# Patient Record
Sex: Male | Born: 1940 | Race: White | Hispanic: No | Marital: Married | Smoking: Former smoker
Health system: Southern US, Community
[De-identification: ages and names within clinical notes are randomized; demographics above are authoritative.]

## PROBLEM LIST (undated history)

## (undated) DIAGNOSIS — R001 Bradycardia, unspecified: Secondary | ICD-10-CM

## (undated) DIAGNOSIS — R4781 Slurred speech: Secondary | ICD-10-CM

## (undated) DIAGNOSIS — Z720 Tobacco use: Secondary | ICD-10-CM

## (undated) DIAGNOSIS — Z9119 Patient's noncompliance with other medical treatment and regimen: Secondary | ICD-10-CM

## (undated) DIAGNOSIS — G459 Transient cerebral ischemic attack, unspecified: Secondary | ICD-10-CM

## (undated) DIAGNOSIS — I10 Essential (primary) hypertension: Secondary | ICD-10-CM

## (undated) DIAGNOSIS — Z91199 Patient's noncompliance with other medical treatment and regimen due to unspecified reason: Secondary | ICD-10-CM

---

## 2006-07-14 ENCOUNTER — Encounter: Admission: RE | Admit: 2006-07-14 | Discharge: 2006-07-14 | Payer: Self-pay | Admitting: Family Medicine

## 2007-07-28 HISTORY — PX: FRACTURE SURGERY: SHX138

## 2008-01-25 ENCOUNTER — Emergency Department (HOSPITAL_COMMUNITY): Admission: EM | Admit: 2008-01-25 | Discharge: 2008-01-25 | Payer: Self-pay | Admitting: Emergency Medicine

## 2008-01-27 ENCOUNTER — Emergency Department (HOSPITAL_COMMUNITY): Admission: EM | Admit: 2008-01-27 | Discharge: 2008-01-27 | Payer: Self-pay | Admitting: Emergency Medicine

## 2008-02-10 ENCOUNTER — Ambulatory Visit (HOSPITAL_COMMUNITY): Admission: RE | Admit: 2008-02-10 | Discharge: 2008-02-11 | Payer: Self-pay | Admitting: Neurosurgery

## 2010-10-30 ENCOUNTER — Other Ambulatory Visit: Payer: Self-pay | Admitting: Neurosurgery

## 2010-10-30 DIAGNOSIS — M542 Cervicalgia: Secondary | ICD-10-CM

## 2010-11-05 ENCOUNTER — Other Ambulatory Visit: Payer: Self-pay | Admitting: Neurosurgery

## 2010-11-05 ENCOUNTER — Ambulatory Visit
Admission: RE | Admit: 2010-11-05 | Discharge: 2010-11-05 | Disposition: A | Discharge status: Home / Self Care | Payer: Worker's Compensation | Source: Ambulatory Visit | Attending: Neurosurgery | Admitting: Neurosurgery

## 2010-11-05 DIAGNOSIS — M542 Cervicalgia: Secondary | ICD-10-CM

## 2010-11-06 ENCOUNTER — Other Ambulatory Visit: Payer: Self-pay | Admitting: Family Medicine

## 2010-11-06 DIAGNOSIS — R221 Localized swelling, mass and lump, neck: Secondary | ICD-10-CM

## 2010-11-07 ENCOUNTER — Ambulatory Visit
Admission: RE | Admit: 2010-11-07 | Discharge: 2010-11-07 | Disposition: A | Discharge status: Home / Self Care | Payer: Medicare Other | Source: Ambulatory Visit | Attending: Family Medicine | Admitting: Family Medicine

## 2010-11-07 DIAGNOSIS — R221 Localized swelling, mass and lump, neck: Secondary | ICD-10-CM

## 2010-11-07 MED ORDER — IOHEXOL 300 MG/ML  SOLN
75.0000 mL | Freq: Once | INTRAMUSCULAR | Status: AC | PRN
  Administered 2010-11-07: 75 mL via INTRAVENOUS

## 2010-12-09 NOTE — Op Note (Signed)
NAMEELDRED, Matthew Hubbard           ACCOUNT NO.:  [id number]   MEDICAL RECORD NO.:  [id number]          PATIENT TYPE:  OIB   LOCATION:  3534                         FACILITY:  [facility]   PHYSICIAN:  Coletta Memos, M.D.     DATE OF BIRTH:  10/31/1940   DATE OF PROCEDURE:  02/10/2008  DATE OF DISCHARGE:                               OPERATIVE REPORT   PREOPERATIVE DIAGNOSES:  1. C5-C6 right facet fracture.  2. Right C6-C7 facet fracture.  3. Disk herniation C5-C6 on the right.   POSTOPERATIVE DIAGNOSES:  1. C5-C6 right facet fracture.  2. Right C6-C7 facet fracture.  3. Disk herniation C5-C6 on the right.   PROCEDURE:  1. Anterior cervical decompression C5-C6, C6-C7.  2. Arthrodesis C5-C6, C6-C7, 6 mm to C5-C6, 7 mm at C6-C7, both      structural allograft.  3. Anterior instrumentation Vectra 34-mm plate with 78-GN screws.   COMPLICATIONS:  None.   ANESTHESIA:  General endotracheal.   SURGEON:  Coletta Memos, MD   ASSISTANT:  Stefani Dama, MD   INDICATIONS:  Matthew Hubbard is a gentleman who presented to my office  just last week on July 9.  He had an accident while at work which left  him with a great deal of neck pain and discomfort and pain into his  right upper extremity.  He had fallen on January 25, 2008 and was told that  his neck looked good.  He then came back, when his pain did not improve  and a CT at that time showed facet fracture at C5-C6, C6-C7, both on the  right side.  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  As a  result of that, I recommended and he agreed to undergo operative  decompression.  He had 4/5 strength in triceps, wrist extensors, and  biceps.   OPERATIVE NOTE:  Matthew Hubbard was brought to the operating room,  intubated, and placed under general anesthetic without difficulty.  His  head was positioned on a horseshoe headrest in a neutral fashion.  His  neck was prepped and he was draped in sterile fashion.  I infiltrated 4  mL 0.5%  lidocaine with 1:200,000 strength epinephrine at the level of  the cricothyroid membrane starting from the midline and extending to the  medial border of the left sternocleidomastoid.  I opened the skin with a  #10 blade and took this down to the platysma.  I then opened the  platysma horizontally with Metzenbaum scissors.  I extended my working  space both rostrally and caudally.  I identified strap muscles,  retracted those medially, and then was able to create an avascular  corridor to the cervical spine.  I placed spinal needle and it showed  that I was at the correct position at C5-C6, C6-C7.  I then proceeded to  reflect the longus colli muscles bilaterally from C5-C6 and C6-C7 disk  spaces.  I then placed self-retaining retractors and first started at C6-  C7 level.  I placed 2 distraction pins, one is C6 and one is C7.  I  opened the disk space with a #  15 blade.  I also opened the disk space at  C5-C6 prior to doing that.   I distracted the disk space at C6-C7 and then using a high-speed drill,  Kerrison punches, and curettes, I was able to fully decompress the  spinal canal.  There were impressive osteophytes, both from C6 and C7  and significant osteophytes in the uncovertebral joints impinging upon  the nerve root.  I was able to remove those and both nerve roots had  free egress at C6-C7 level.  I then prepared for arthrodesis by leveling  the surfaces at C6 and C7.   I placed a 7-mm allograft after sizing the space.  I then ensured  hemostasis.  I then turned my attention to C5-C6.  I placed a  distraction pin at C5 removing one from C7.  I then proceeded with my  decompression by using curettes, high-speed drill, Kerrison punches.  Again, I was met with large osteophytes from both C5 and C6 which took a  good deal of drilling, but I was able to again fully decompress the  spinal canal.  I also again had to remove a great deal of spondylitic  bone in the uncovertebral joint  impinging upon the nerve roots on both  right and left side at C5-C6.  Both C6 nerve roots were also  decompressed.  I then irrigated the wound.  I then prepared for  arthrodesis by leveling the surfaces at C5 and C6.  I placed the 6-mm  allograft at this level.  I then turned my attention to the anterior  instrumentation.   I placed a 34-mm plate with Dr. Verlee Rossetti assistance, two screws in C5,  two in C6, and two in C7.  The screws were self-tapping.  This was done  without difficulty.  X-ray showed that the plate, plug, and screws were  all in good position.  I then irrigated the wound.  I then closed the  wound in layered fashion with Dr. Verlee Rossetti assistance using Vicryl  sutures to reapproximate the platysma and subcuticular layers.  I used  Dermabond for sterile dressing.           ______________________________  Coletta Memos, M.D.     KC/MEDQ  D:  02/10/2008  T:  02/11/2008  Job:  [id number]

## 2011-04-24 LAB — CBC
HCT: 45.9
Hemoglobin: 16.1
MCHC: 35.1
MCV: 90.5
Platelets: 236
RDW: 13.2
WBC: 6.1

## 2011-04-24 LAB — DIFFERENTIAL
Basophils Absolute: 0.1
Eosinophils Absolute: 0.3
Monocytes Relative: 10
Neutro Abs: 2.8

## 2011-04-24 LAB — BASIC METABOLIC PANEL
Chloride: 103
Creatinine, Ser: 1.04
Glucose, Bld: 109 — ABNORMAL HIGH
Sodium: 139

## 2011-08-20 DIAGNOSIS — IMO0002 Reserved for concepts with insufficient information to code with codable children: Secondary | ICD-10-CM | POA: Diagnosis not present

## 2011-08-20 DIAGNOSIS — M9981 Other biomechanical lesions of cervical region: Secondary | ICD-10-CM | POA: Diagnosis not present

## 2011-08-20 DIAGNOSIS — M5030 Other cervical disc degeneration, unspecified cervical region: Secondary | ICD-10-CM | POA: Diagnosis not present

## 2011-08-20 DIAGNOSIS — M999 Biomechanical lesion, unspecified: Secondary | ICD-10-CM | POA: Diagnosis not present

## 2011-09-23 DIAGNOSIS — M9981 Other biomechanical lesions of cervical region: Secondary | ICD-10-CM | POA: Diagnosis not present

## 2011-09-23 DIAGNOSIS — M999 Biomechanical lesion, unspecified: Secondary | ICD-10-CM | POA: Diagnosis not present

## 2011-09-23 DIAGNOSIS — M5030 Other cervical disc degeneration, unspecified cervical region: Secondary | ICD-10-CM | POA: Diagnosis not present

## 2011-09-23 DIAGNOSIS — IMO0002 Reserved for concepts with insufficient information to code with codable children: Secondary | ICD-10-CM | POA: Diagnosis not present

## 2011-11-13 DIAGNOSIS — I10 Essential (primary) hypertension: Secondary | ICD-10-CM | POA: Diagnosis not present

## 2011-11-13 DIAGNOSIS — E7800 Pure hypercholesterolemia, unspecified: Secondary | ICD-10-CM | POA: Diagnosis not present

## 2012-03-23 DIAGNOSIS — L259 Unspecified contact dermatitis, unspecified cause: Secondary | ICD-10-CM | POA: Diagnosis not present

## 2012-05-19 DIAGNOSIS — E7800 Pure hypercholesterolemia, unspecified: Secondary | ICD-10-CM | POA: Diagnosis not present

## 2012-05-19 DIAGNOSIS — I10 Essential (primary) hypertension: Secondary | ICD-10-CM | POA: Diagnosis not present

## 2012-05-19 DIAGNOSIS — Z1331 Encounter for screening for depression: Secondary | ICD-10-CM | POA: Diagnosis not present

## 2012-08-03 DIAGNOSIS — N281 Cyst of kidney, acquired: Secondary | ICD-10-CM | POA: Diagnosis not present

## 2012-11-18 DIAGNOSIS — I10 Essential (primary) hypertension: Secondary | ICD-10-CM | POA: Diagnosis not present

## 2012-11-18 DIAGNOSIS — E7800 Pure hypercholesterolemia, unspecified: Secondary | ICD-10-CM | POA: Diagnosis not present

## 2012-11-18 DIAGNOSIS — Z79899 Other long term (current) drug therapy: Secondary | ICD-10-CM | POA: Diagnosis not present

## 2012-11-18 DIAGNOSIS — R51 Headache: Secondary | ICD-10-CM | POA: Diagnosis not present

## 2012-11-18 DIAGNOSIS — R634 Abnormal weight loss: Secondary | ICD-10-CM | POA: Diagnosis not present

## 2013-01-15 ENCOUNTER — Ambulatory Visit (INDEPENDENT_AMBULATORY_CARE_PROVIDER_SITE_OTHER): Payer: Medicare Other | Admitting: Emergency Medicine

## 2013-01-15 VITALS — BP 146/90 | HR 60 | Temp 97.6°F | Resp 16 | Ht 69.0 in | Wt 169.0 lb

## 2013-01-15 DIAGNOSIS — H5789 Other specified disorders of eye and adnexa: Secondary | ICD-10-CM

## 2013-01-15 DIAGNOSIS — H579 Unspecified disorder of eye and adnexa: Secondary | ICD-10-CM

## 2013-01-15 NOTE — Addendum Note (Signed)
Addended by: Maryann Alar on: 01/15/2013 07:14 PM   Modules accepted: Level of Service

## 2013-01-15 NOTE — Patient Instructions (Signed)
Please call tomorrow if you continue have a foreign body sensation and we will get you over to see the ophthalmologist. He also will need to followup with the doctor because your visual acuity was only 20/70

## 2013-01-15 NOTE — Progress Notes (Signed)
  Subjective:    Patient ID: Matthew Hubbard, male    DOB: 1941/05/09, 72 y.o.   MRN: [id number]  HPI Pt states yesterday a wood chip fell into his Right eye-actually a piece of sawdust. He is building a building. He wears glasses. He states every once in a while the pain gets him. It was painful a lot last evening.  The patient states he has regular eye checks. 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 When he blinks his eye he has a sharp discomfort in the eye.    Review of Systems     Objective:   Physical Exam pupils are equal and reactive to light fields are full to confrontation. Disc appears normal. The cornea appeared normal there does appear to be some puffiness of the upper leg. 2 drops of numbing medication were used the lid was everted and swabbed with a moist Q-tip. Fluorescein  was used  And  no corneal uptake was obtained. Underneath the upper lid was a small bluish  area was seen. This could not removed with using a cotton swab and appeared to be underneath the surface.        Assessment & Plan:  Patient to call tomorrow and if he continues to have problems we'll get him over to see the doctor for further evaluation.

## 2013-01-16 ENCOUNTER — Telehealth: Payer: Self-pay | Admitting: Emergency Medicine

## 2013-01-16 NOTE — Telephone Encounter (Signed)
Please call patient and be sure his symptoms have resolved. If he continues to have a foreign body sensation in his eye please call and see if Dr. Dione Booze can give him a work in appointment today.

## 2013-01-16 NOTE — Telephone Encounter (Signed)
Called him and he feels better today, he agrees to call me back if he has any more problems, or if he gets worse

## 2013-05-22 DIAGNOSIS — Z79899 Other long term (current) drug therapy: Secondary | ICD-10-CM | POA: Diagnosis not present

## 2013-05-22 DIAGNOSIS — I10 Essential (primary) hypertension: Secondary | ICD-10-CM | POA: Diagnosis not present

## 2013-05-22 DIAGNOSIS — E7800 Pure hypercholesterolemia, unspecified: Secondary | ICD-10-CM | POA: Diagnosis not present

## 2013-06-05 DIAGNOSIS — Z1331 Encounter for screening for depression: Secondary | ICD-10-CM | POA: Diagnosis not present

## 2013-06-05 DIAGNOSIS — I10 Essential (primary) hypertension: Secondary | ICD-10-CM | POA: Diagnosis not present

## 2013-07-05 DIAGNOSIS — F17200 Nicotine dependence, unspecified, uncomplicated: Secondary | ICD-10-CM | POA: Diagnosis not present

## 2013-07-05 DIAGNOSIS — I10 Essential (primary) hypertension: Secondary | ICD-10-CM | POA: Diagnosis not present

## 2013-08-11 DIAGNOSIS — I10 Essential (primary) hypertension: Secondary | ICD-10-CM | POA: Diagnosis not present

## 2013-08-15 DIAGNOSIS — H905 Unspecified sensorineural hearing loss: Secondary | ICD-10-CM | POA: Diagnosis not present

## 2013-08-15 DIAGNOSIS — H6120 Impacted cerumen, unspecified ear: Secondary | ICD-10-CM | POA: Diagnosis not present

## 2013-08-15 DIAGNOSIS — H9319 Tinnitus, unspecified ear: Secondary | ICD-10-CM | POA: Diagnosis not present

## 2013-09-11 DIAGNOSIS — I10 Essential (primary) hypertension: Secondary | ICD-10-CM | POA: Diagnosis not present

## 2014-03-23 DIAGNOSIS — E7800 Pure hypercholesterolemia, unspecified: Secondary | ICD-10-CM | POA: Diagnosis not present

## 2014-03-23 DIAGNOSIS — I10 Essential (primary) hypertension: Secondary | ICD-10-CM | POA: Diagnosis not present

## 2014-09-21 DIAGNOSIS — N281 Cyst of kidney, acquired: Secondary | ICD-10-CM | POA: Diagnosis not present

## 2014-09-21 DIAGNOSIS — Z87442 Personal history of urinary calculi: Secondary | ICD-10-CM | POA: Diagnosis not present

## 2014-09-25 DIAGNOSIS — Z1389 Encounter for screening for other disorder: Secondary | ICD-10-CM | POA: Diagnosis not present

## 2014-09-25 DIAGNOSIS — Z23 Encounter for immunization: Secondary | ICD-10-CM | POA: Diagnosis not present

## 2014-09-25 DIAGNOSIS — I10 Essential (primary) hypertension: Secondary | ICD-10-CM | POA: Diagnosis not present

## 2014-09-25 DIAGNOSIS — E780 Pure hypercholesterolemia: Secondary | ICD-10-CM | POA: Diagnosis not present

## 2014-09-28 DIAGNOSIS — M549 Dorsalgia, unspecified: Secondary | ICD-10-CM | POA: Diagnosis not present

## 2014-09-28 DIAGNOSIS — N281 Cyst of kidney, acquired: Secondary | ICD-10-CM | POA: Diagnosis not present

## 2015-04-16 DIAGNOSIS — I10 Essential (primary) hypertension: Secondary | ICD-10-CM | POA: Diagnosis not present

## 2015-04-16 DIAGNOSIS — F17200 Nicotine dependence, unspecified, uncomplicated: Secondary | ICD-10-CM | POA: Diagnosis not present

## 2015-04-16 DIAGNOSIS — E780 Pure hypercholesterolemia: Secondary | ICD-10-CM | POA: Diagnosis not present

## 2015-05-16 ENCOUNTER — Telehealth: Payer: Self-pay | Admitting: Acute Care

## 2015-05-16 NOTE — Telephone Encounter (Signed)
Dear Matthew Hubbard, We have attempted to call you several times to schedule the lung screening Dr. Marisue Humble requested you have performed. We have been unable to contact you by phone. Please call the number below at your earliest convenience so that we can get you scheduled for your screening. We look forward to participating in your care.  Thank you,  [facility] [phone]

## 2015-08-01 ENCOUNTER — Telehealth: Payer: Self-pay | Admitting: Acute Care

## 2015-08-01 ENCOUNTER — Encounter: Payer: Self-pay | Admitting: Acute Care

## 2015-08-01 NOTE — Telephone Encounter (Signed)
Patient walked into office. Patient states he has been trying to contact the [facility] since September. We have called the patient x3 and sent letter with no return call back.   Patient states he smokes 1/4ppd for 40 years. With this information patient does not qualify for the program. The Patient currently has a 10 pack/year smoking history and this program requires the patient to be a 30 pack/year smoker. Patient understood and was upset that he was told he did qualify for this program. Patient stated he only wanted to have Xray. Informed patient that I would copy this message to Dr. Marisue Humble for him to possibly schedule this for him.   Nothing further needed at this time.

## 2015-09-02 DIAGNOSIS — Z6826 Body mass index (BMI) 26.0-26.9, adult: Secondary | ICD-10-CM | POA: Diagnosis not present

## 2015-09-02 DIAGNOSIS — M5442 Lumbago with sciatica, left side: Secondary | ICD-10-CM | POA: Diagnosis not present

## 2015-09-02 DIAGNOSIS — R030 Elevated blood-pressure reading, without diagnosis of hypertension: Secondary | ICD-10-CM | POA: Diagnosis not present

## 2015-09-02 DIAGNOSIS — M5440 Lumbago with sciatica, unspecified side: Secondary | ICD-10-CM | POA: Insufficient documentation

## 2015-09-07 DIAGNOSIS — M5442 Lumbago with sciatica, left side: Secondary | ICD-10-CM | POA: Diagnosis not present

## 2015-09-07 DIAGNOSIS — M5126 Other intervertebral disc displacement, lumbar region: Secondary | ICD-10-CM | POA: Diagnosis not present

## 2015-09-10 DIAGNOSIS — M47816 Spondylosis without myelopathy or radiculopathy, lumbar region: Secondary | ICD-10-CM | POA: Insufficient documentation

## 2015-09-10 DIAGNOSIS — Z6825 Body mass index (BMI) 25.0-25.9, adult: Secondary | ICD-10-CM | POA: Diagnosis not present

## 2015-09-10 DIAGNOSIS — M4726 Other spondylosis with radiculopathy, lumbar region: Secondary | ICD-10-CM | POA: Diagnosis not present

## 2015-09-10 DIAGNOSIS — M5442 Lumbago with sciatica, left side: Secondary | ICD-10-CM | POA: Diagnosis not present

## 2015-11-13 DIAGNOSIS — M9903 Segmental and somatic dysfunction of lumbar region: Secondary | ICD-10-CM | POA: Diagnosis not present

## 2015-11-13 DIAGNOSIS — M791 Myalgia: Secondary | ICD-10-CM | POA: Diagnosis not present

## 2015-11-13 DIAGNOSIS — M9904 Segmental and somatic dysfunction of sacral region: Secondary | ICD-10-CM | POA: Diagnosis not present

## 2015-11-13 DIAGNOSIS — M5387 Other specified dorsopathies, lumbosacral region: Secondary | ICD-10-CM | POA: Diagnosis not present

## 2015-11-13 DIAGNOSIS — M461 Sacroiliitis, not elsewhere classified: Secondary | ICD-10-CM | POA: Diagnosis not present

## 2015-11-13 DIAGNOSIS — M9905 Segmental and somatic dysfunction of pelvic region: Secondary | ICD-10-CM | POA: Diagnosis not present

## 2015-11-20 DIAGNOSIS — M461 Sacroiliitis, not elsewhere classified: Secondary | ICD-10-CM | POA: Diagnosis not present

## 2015-11-20 DIAGNOSIS — M9903 Segmental and somatic dysfunction of lumbar region: Secondary | ICD-10-CM | POA: Diagnosis not present

## 2015-11-20 DIAGNOSIS — M5387 Other specified dorsopathies, lumbosacral region: Secondary | ICD-10-CM | POA: Diagnosis not present

## 2015-11-20 DIAGNOSIS — M791 Myalgia: Secondary | ICD-10-CM | POA: Diagnosis not present

## 2015-11-20 DIAGNOSIS — M9904 Segmental and somatic dysfunction of sacral region: Secondary | ICD-10-CM | POA: Diagnosis not present

## 2015-11-20 DIAGNOSIS — M9905 Segmental and somatic dysfunction of pelvic region: Secondary | ICD-10-CM | POA: Diagnosis not present

## 2015-11-28 DIAGNOSIS — M5387 Other specified dorsopathies, lumbosacral region: Secondary | ICD-10-CM | POA: Diagnosis not present

## 2015-11-28 DIAGNOSIS — M461 Sacroiliitis, not elsewhere classified: Secondary | ICD-10-CM | POA: Diagnosis not present

## 2015-11-28 DIAGNOSIS — M9904 Segmental and somatic dysfunction of sacral region: Secondary | ICD-10-CM | POA: Diagnosis not present

## 2015-11-28 DIAGNOSIS — M9905 Segmental and somatic dysfunction of pelvic region: Secondary | ICD-10-CM | POA: Diagnosis not present

## 2015-11-28 DIAGNOSIS — M791 Myalgia: Secondary | ICD-10-CM | POA: Diagnosis not present

## 2015-11-28 DIAGNOSIS — M9903 Segmental and somatic dysfunction of lumbar region: Secondary | ICD-10-CM | POA: Diagnosis not present

## 2016-01-16 DIAGNOSIS — L821 Other seborrheic keratosis: Secondary | ICD-10-CM | POA: Diagnosis not present

## 2016-01-16 DIAGNOSIS — L820 Inflamed seborrheic keratosis: Secondary | ICD-10-CM | POA: Diagnosis not present

## 2016-03-05 DIAGNOSIS — M9904 Segmental and somatic dysfunction of sacral region: Secondary | ICD-10-CM | POA: Diagnosis not present

## 2016-03-05 DIAGNOSIS — M9905 Segmental and somatic dysfunction of pelvic region: Secondary | ICD-10-CM | POA: Diagnosis not present

## 2016-03-05 DIAGNOSIS — M9903 Segmental and somatic dysfunction of lumbar region: Secondary | ICD-10-CM | POA: Diagnosis not present

## 2016-03-05 DIAGNOSIS — M5387 Other specified dorsopathies, lumbosacral region: Secondary | ICD-10-CM | POA: Diagnosis not present

## 2016-03-05 DIAGNOSIS — M461 Sacroiliitis, not elsewhere classified: Secondary | ICD-10-CM | POA: Diagnosis not present

## 2016-03-05 DIAGNOSIS — M791 Myalgia: Secondary | ICD-10-CM | POA: Diagnosis not present

## 2016-05-11 DIAGNOSIS — M9905 Segmental and somatic dysfunction of pelvic region: Secondary | ICD-10-CM | POA: Diagnosis not present

## 2016-05-11 DIAGNOSIS — M5387 Other specified dorsopathies, lumbosacral region: Secondary | ICD-10-CM | POA: Diagnosis not present

## 2016-05-11 DIAGNOSIS — M461 Sacroiliitis, not elsewhere classified: Secondary | ICD-10-CM | POA: Diagnosis not present

## 2016-05-11 DIAGNOSIS — M9903 Segmental and somatic dysfunction of lumbar region: Secondary | ICD-10-CM | POA: Diagnosis not present

## 2016-05-11 DIAGNOSIS — M791 Myalgia: Secondary | ICD-10-CM | POA: Diagnosis not present

## 2016-05-11 DIAGNOSIS — M9904 Segmental and somatic dysfunction of sacral region: Secondary | ICD-10-CM | POA: Diagnosis not present

## 2016-05-14 DIAGNOSIS — M791 Myalgia: Secondary | ICD-10-CM | POA: Diagnosis not present

## 2016-05-14 DIAGNOSIS — M5387 Other specified dorsopathies, lumbosacral region: Secondary | ICD-10-CM | POA: Diagnosis not present

## 2016-05-14 DIAGNOSIS — M9903 Segmental and somatic dysfunction of lumbar region: Secondary | ICD-10-CM | POA: Diagnosis not present

## 2016-05-14 DIAGNOSIS — M9905 Segmental and somatic dysfunction of pelvic region: Secondary | ICD-10-CM | POA: Diagnosis not present

## 2016-05-14 DIAGNOSIS — M9904 Segmental and somatic dysfunction of sacral region: Secondary | ICD-10-CM | POA: Diagnosis not present

## 2016-05-14 DIAGNOSIS — M461 Sacroiliitis, not elsewhere classified: Secondary | ICD-10-CM | POA: Diagnosis not present

## 2016-05-21 DIAGNOSIS — M791 Myalgia: Secondary | ICD-10-CM | POA: Diagnosis not present

## 2016-05-21 DIAGNOSIS — M9905 Segmental and somatic dysfunction of pelvic region: Secondary | ICD-10-CM | POA: Diagnosis not present

## 2016-05-21 DIAGNOSIS — M9903 Segmental and somatic dysfunction of lumbar region: Secondary | ICD-10-CM | POA: Diagnosis not present

## 2016-05-21 DIAGNOSIS — M5387 Other specified dorsopathies, lumbosacral region: Secondary | ICD-10-CM | POA: Diagnosis not present

## 2016-05-21 DIAGNOSIS — M461 Sacroiliitis, not elsewhere classified: Secondary | ICD-10-CM | POA: Diagnosis not present

## 2016-05-21 DIAGNOSIS — M9904 Segmental and somatic dysfunction of sacral region: Secondary | ICD-10-CM | POA: Diagnosis not present

## 2016-06-09 DIAGNOSIS — E7800 Pure hypercholesterolemia, unspecified: Secondary | ICD-10-CM | POA: Diagnosis not present

## 2016-06-09 DIAGNOSIS — Z1389 Encounter for screening for other disorder: Secondary | ICD-10-CM | POA: Diagnosis not present

## 2016-06-09 DIAGNOSIS — I10 Essential (primary) hypertension: Secondary | ICD-10-CM | POA: Diagnosis not present

## 2016-06-15 DIAGNOSIS — M9904 Segmental and somatic dysfunction of sacral region: Secondary | ICD-10-CM | POA: Diagnosis not present

## 2016-06-15 DIAGNOSIS — M9903 Segmental and somatic dysfunction of lumbar region: Secondary | ICD-10-CM | POA: Diagnosis not present

## 2016-06-15 DIAGNOSIS — M9905 Segmental and somatic dysfunction of pelvic region: Secondary | ICD-10-CM | POA: Diagnosis not present

## 2016-06-15 DIAGNOSIS — M461 Sacroiliitis, not elsewhere classified: Secondary | ICD-10-CM | POA: Diagnosis not present

## 2016-06-15 DIAGNOSIS — M5387 Other specified dorsopathies, lumbosacral region: Secondary | ICD-10-CM | POA: Diagnosis not present

## 2016-06-15 DIAGNOSIS — M791 Myalgia: Secondary | ICD-10-CM | POA: Diagnosis not present

## 2016-06-22 DIAGNOSIS — M9905 Segmental and somatic dysfunction of pelvic region: Secondary | ICD-10-CM | POA: Diagnosis not present

## 2016-06-22 DIAGNOSIS — M791 Myalgia: Secondary | ICD-10-CM | POA: Diagnosis not present

## 2016-06-22 DIAGNOSIS — M461 Sacroiliitis, not elsewhere classified: Secondary | ICD-10-CM | POA: Diagnosis not present

## 2016-06-22 DIAGNOSIS — M9904 Segmental and somatic dysfunction of sacral region: Secondary | ICD-10-CM | POA: Diagnosis not present

## 2016-06-22 DIAGNOSIS — M9903 Segmental and somatic dysfunction of lumbar region: Secondary | ICD-10-CM | POA: Diagnosis not present

## 2016-06-22 DIAGNOSIS — M5387 Other specified dorsopathies, lumbosacral region: Secondary | ICD-10-CM | POA: Diagnosis not present

## 2016-06-26 DIAGNOSIS — M5441 Lumbago with sciatica, right side: Secondary | ICD-10-CM | POA: Diagnosis not present

## 2016-06-26 DIAGNOSIS — M5442 Lumbago with sciatica, left side: Secondary | ICD-10-CM | POA: Diagnosis not present

## 2016-06-26 DIAGNOSIS — M25511 Pain in right shoulder: Secondary | ICD-10-CM | POA: Diagnosis not present

## 2016-07-02 DIAGNOSIS — M5387 Other specified dorsopathies, lumbosacral region: Secondary | ICD-10-CM | POA: Diagnosis not present

## 2016-07-02 DIAGNOSIS — M9905 Segmental and somatic dysfunction of pelvic region: Secondary | ICD-10-CM | POA: Diagnosis not present

## 2016-07-02 DIAGNOSIS — M791 Myalgia: Secondary | ICD-10-CM | POA: Diagnosis not present

## 2016-07-02 DIAGNOSIS — M461 Sacroiliitis, not elsewhere classified: Secondary | ICD-10-CM | POA: Diagnosis not present

## 2016-07-02 DIAGNOSIS — M9904 Segmental and somatic dysfunction of sacral region: Secondary | ICD-10-CM | POA: Diagnosis not present

## 2016-07-02 DIAGNOSIS — M9903 Segmental and somatic dysfunction of lumbar region: Secondary | ICD-10-CM | POA: Diagnosis not present

## 2016-07-06 DIAGNOSIS — M25511 Pain in right shoulder: Secondary | ICD-10-CM | POA: Diagnosis not present

## 2016-07-07 DIAGNOSIS — I10 Essential (primary) hypertension: Secondary | ICD-10-CM | POA: Diagnosis not present

## 2016-07-15 DIAGNOSIS — M9904 Segmental and somatic dysfunction of sacral region: Secondary | ICD-10-CM | POA: Diagnosis not present

## 2016-07-15 DIAGNOSIS — M461 Sacroiliitis, not elsewhere classified: Secondary | ICD-10-CM | POA: Diagnosis not present

## 2016-07-15 DIAGNOSIS — M791 Myalgia: Secondary | ICD-10-CM | POA: Diagnosis not present

## 2016-07-15 DIAGNOSIS — M9905 Segmental and somatic dysfunction of pelvic region: Secondary | ICD-10-CM | POA: Diagnosis not present

## 2016-07-15 DIAGNOSIS — M5387 Other specified dorsopathies, lumbosacral region: Secondary | ICD-10-CM | POA: Diagnosis not present

## 2016-07-15 DIAGNOSIS — M9903 Segmental and somatic dysfunction of lumbar region: Secondary | ICD-10-CM | POA: Diagnosis not present

## 2016-08-05 DIAGNOSIS — I10 Essential (primary) hypertension: Secondary | ICD-10-CM | POA: Diagnosis not present

## 2016-09-08 ENCOUNTER — Observation Stay (HOSPITAL_COMMUNITY)
Admission: EM | Admit: 2016-09-08 | Discharge: 2016-09-09 | Disposition: A | Discharge status: Home / Self Care | Payer: Medicare Other | Attending: Internal Medicine | Admitting: Internal Medicine

## 2016-09-08 ENCOUNTER — Emergency Department (HOSPITAL_COMMUNITY): Payer: Medicare Other

## 2016-09-08 ENCOUNTER — Observation Stay (HOSPITAL_COMMUNITY): Payer: Medicare Other

## 2016-09-08 ENCOUNTER — Encounter (HOSPITAL_COMMUNITY): Payer: Self-pay

## 2016-09-08 DIAGNOSIS — Z9114 Patient's other noncompliance with medication regimen: Secondary | ICD-10-CM | POA: Insufficient documentation

## 2016-09-08 DIAGNOSIS — R05 Cough: Secondary | ICD-10-CM | POA: Diagnosis not present

## 2016-09-08 DIAGNOSIS — Z79899 Other long term (current) drug therapy: Secondary | ICD-10-CM | POA: Diagnosis not present

## 2016-09-08 DIAGNOSIS — Z7982 Long term (current) use of aspirin: Secondary | ICD-10-CM | POA: Insufficient documentation

## 2016-09-08 DIAGNOSIS — G459 Transient cerebral ischemic attack, unspecified: Secondary | ICD-10-CM | POA: Diagnosis not present

## 2016-09-08 DIAGNOSIS — Z87891 Personal history of nicotine dependence: Secondary | ICD-10-CM | POA: Diagnosis not present

## 2016-09-08 DIAGNOSIS — R030 Elevated blood-pressure reading, without diagnosis of hypertension: Secondary | ICD-10-CM | POA: Diagnosis not present

## 2016-09-08 DIAGNOSIS — E785 Hyperlipidemia, unspecified: Secondary | ICD-10-CM | POA: Diagnosis not present

## 2016-09-08 DIAGNOSIS — R4781 Slurred speech: Secondary | ICD-10-CM | POA: Insufficient documentation

## 2016-09-08 DIAGNOSIS — Z720 Tobacco use: Secondary | ICD-10-CM | POA: Diagnosis present

## 2016-09-08 DIAGNOSIS — I10 Essential (primary) hypertension: Secondary | ICD-10-CM | POA: Diagnosis not present

## 2016-09-08 HISTORY — DX: Transient cerebral ischemic attack, unspecified: G45.9

## 2016-09-08 HISTORY — DX: Bradycardia, unspecified: R00.1

## 2016-09-08 HISTORY — DX: Slurred speech: R47.81

## 2016-09-08 HISTORY — DX: Patient's noncompliance with other medical treatment and regimen: Z91.19

## 2016-09-08 HISTORY — DX: Patient's noncompliance with other medical treatment and regimen due to unspecified reason: Z91.199

## 2016-09-08 HISTORY — DX: Essential (primary) hypertension: I10

## 2016-09-08 HISTORY — DX: Tobacco use: Z72.0

## 2016-09-08 LAB — COMPREHENSIVE METABOLIC PANEL
ALBUMIN: 3.6 g/dL (ref 3.5–5.0)
ALT: 16 U/L — ABNORMAL LOW (ref 17–63)
ANION GAP: 10 (ref 5–15)
AST: 26 U/L (ref 15–41)
Alkaline Phosphatase: 98 U/L (ref 38–126)
BUN: 14 mg/dL (ref 6–20)
CHLORIDE: 102 mmol/L (ref 101–111)
CO2: 26 mmol/L (ref 22–32)
Calcium: 9.5 mg/dL (ref 8.9–10.3)
Creatinine, Ser: 1.18 mg/dL (ref 0.61–1.24)
GFR calc Af Amer: >60 mL/min (ref >60)
GFR calc non Af Amer: 59 mL/min — ABNORMAL LOW (ref >60)
GLUCOSE: 119 mg/dL — AB (ref 65–99)
POTASSIUM: 3.7 mmol/L (ref 3.5–5.1)
SODIUM: 138 mmol/L (ref 135–145)
Total Bilirubin: 0.6 mg/dL (ref 0.3–1.2)
Total Protein: 6.3 g/dL — ABNORMAL LOW (ref 6.5–8.1)

## 2016-09-08 LAB — CBC WITH DIFFERENTIAL/PLATELET
BASOS ABS: 0 10*3/uL (ref 0.0–0.1)
BASOS PCT: 1 %
EOS ABS: 0.2 10*3/uL (ref 0.0–0.7)
Eosinophils Relative: 4 %
HEMATOCRIT: 46.5 % (ref 39.0–52.0)
HEMOGLOBIN: 16 g/dL (ref 13.0–17.0)
Lymphocytes Relative: 34 %
Lymphs Abs: 1.9 10*3/uL (ref 0.7–4.0)
MCH: 30.9 pg (ref 26.0–34.0)
MCHC: 34.4 g/dL (ref 30.0–36.0)
MCV: 89.9 fL (ref 78.0–100.0)
MONO ABS: 0.4 10*3/uL (ref 0.1–1.0)
MONOS PCT: 6 %
NEUTROS ABS: 3.1 10*3/uL (ref 1.7–7.7)
NEUTROS PCT: 55 %
Platelets: 149 10*3/uL — ABNORMAL LOW (ref 150–400)
RBC: 5.17 MIL/uL (ref 4.22–5.81)
RDW: 13.4 % (ref 11.5–15.5)
WBC: 5.6 10*3/uL (ref 4.0–10.5)

## 2016-09-08 LAB — I-STAT TROPONIN, ED: TROPONIN I, POC: 0 ng/mL (ref 0.00–0.08)

## 2016-09-08 LAB — URINALYSIS, ROUTINE W REFLEX MICROSCOPIC
BILIRUBIN URINE: NEGATIVE
Glucose, UA: NEGATIVE mg/dL
Hgb urine dipstick: NEGATIVE
KETONES UR: NEGATIVE mg/dL
Nitrite: NEGATIVE
PH: 5 (ref 5.0–8.0)
PROTEIN: NEGATIVE mg/dL
Specific Gravity, Urine: 1.006 (ref 1.005–1.030)

## 2016-09-08 LAB — LIPID PANEL
CHOLESTEROL: 227 mg/dL — AB (ref 0–200)
HDL: 66 mg/dL (ref >40)
LDL Cholesterol: 147 mg/dL — ABNORMAL HIGH (ref 0–99)
TRIGLYCERIDES: 70 mg/dL (ref <150)
Total CHOL/HDL Ratio: 3.4 RATIO
VLDL: 14 mg/dL (ref 0–40)

## 2016-09-08 MED ORDER — STROKE: EARLY STAGES OF RECOVERY BOOK
Freq: Once | Status: AC
  Administered 2016-09-08: 23:00:00
  Filled 2016-09-08 (×2): qty 1

## 2016-09-08 MED ORDER — SENNOSIDES-DOCUSATE SODIUM 8.6-50 MG PO TABS
1.0000 | ORAL_TABLET | Freq: Every evening | ORAL | Status: DC | PRN
  Filled 2016-09-08: qty 1

## 2016-09-08 MED ORDER — SODIUM CHLORIDE 0.9 % IV SOLN
INTRAVENOUS | Status: AC
  Administered 2016-09-08: 21:00:00 via INTRAVENOUS

## 2016-09-08 MED ORDER — FUROSEMIDE 20 MG PO TABS
20.0000 mg | ORAL_TABLET | Freq: Every day | ORAL | Status: DC
  Administered 2016-09-08 – 2016-09-09 (×2): 20 mg via ORAL
  Filled 2016-09-08 (×2): qty 1

## 2016-09-08 MED ORDER — ENOXAPARIN SODIUM 40 MG/0.4ML ~~LOC~~ SOLN
40.0000 mg | SUBCUTANEOUS | Status: DC
  Administered 2016-09-08: 40 mg via SUBCUTANEOUS
  Filled 2016-09-08 (×2): qty 0.4

## 2016-09-08 MED ORDER — SODIUM CHLORIDE 0.9 % IV BOLUS (SEPSIS)
1000.0000 mL | Freq: Once | INTRAVENOUS | Status: AC
  Administered 2016-09-08: 1000 mL via INTRAVENOUS

## 2016-09-08 MED ORDER — ASPIRIN 325 MG PO TABS: 325.0000 mg | ORAL_TABLET | ORAL | Status: DC | PRN

## 2016-09-08 MED ORDER — ACETAMINOPHEN 160 MG/5ML PO SOLN: 650.0000 mg | ORAL | Status: DC | PRN

## 2016-09-08 MED ORDER — ACETAMINOPHEN 650 MG RE SUPP: 650.0000 mg | RECTAL | Status: DC | PRN

## 2016-09-08 MED ORDER — MAGNESIUM 200 MG PO TABS
200.0000 mg | ORAL_TABLET | Freq: Every day | ORAL | Status: DC
  Administered 2016-09-08 – 2016-09-09 (×2): 200 mg via ORAL
  Filled 2016-09-08 (×4): qty 1

## 2016-09-08 MED ORDER — ACETAMINOPHEN 325 MG PO TABS
650.0000 mg | ORAL_TABLET | ORAL | Status: DC | PRN
Start: 2016-09-08 — End: 2016-09-09

## 2016-09-08 NOTE — ED Notes (Signed)
Pt in restroom inside of room to collect urine specimen.

## 2016-09-08 NOTE — Progress Notes (Signed)
Patient arrived from the ED to 5M16. Safety precautions and orders reviewed with patient/family. TELE applied and confirmed. VSS. Pt denied pain. No other distress noted. Will continue to monitor.  Ave Filter, RN

## 2016-09-08 NOTE — ED Notes (Signed)
EKG given to Dr. Cook  

## 2016-09-08 NOTE — ED Notes (Signed)
EMS reports to RN that pt has stopped taking BP meds because of issues w/ erectile dysfunction

## 2016-09-08 NOTE — H&P (Signed)
History and Physical    Matthew Hubbard [id number] DOB: 11-25-1940 DOA: 09/08/2016  PCP: Simona Huh, MD Patient coming from: home  Chief Complaint: slurred speech  HPI: Matthew Hubbard is a 76 y.o. male with medical history significant hypertension, hyperlipidemia noncompliant with medications due to side effects, tobacco use presents to the emergency Department chief complaint of slurred speech. Initial evaluation includes discussion with neurology per Dr Lacinda Axon, who recommends admission for observation and TIA workup  Information is obtained from the patient and the chart. Patient states he was in his usual state of health until this morning when he awakened around 9 AM "not feeling right in the head". He states that he noted that his speech was slurred and he sounded "drunk". Recently his blood pressure was elevated at the time patient admits to noncompliance with his blood pressure medicine due to side effects. He is unable to articulate what his blood pressure medicine is. Associated symptoms include some tremor in the left hand as he was attempting to sign forms for EMS. He denies headache dizziness syncope or near-syncope. He denies numbness or tingling of his face tongue lips. He denies any difficulty chewing or swallowing. He denies chest pain palpitations does admit to some shortness of breath with exertion. He denies abdominal pain nausea vomiting lower extremity edema or orthopnea. He reports he used tobacco since he was 76 years old but quit 3 months ago.   ED Course: In the emergency department he is afebrile hemodynamically stable and not hypoxic.  Review of Systems: As per HPI otherwise 10 point review of systems negative.   Ambulatory Status: He ambulates independently is independent with ADLs  Past Medical History:  Diagnosis Date  . Bradycardia   . Hypertension   . Non-compliance   . Slurred speech   . TIA (transient ischemic attack)   . Tobacco abuse      Past Surgical History:  Procedure Laterality Date  . FRACTURE SURGERY  2009   neck    Social History   Social History  . Marital status: Married    Spouse name: N/A  . Number of children: N/A  . Years of education: N/A   Occupational History  . Not on file.   Social History Main Topics  . Smoking status: Former Smoker    Packs/day: 0.25    Years: 40.00    Types: Cigarettes    Quit date: 06/08/2016  . Smokeless tobacco: Never Used  . Alcohol use 0.0 oz/week     Comment: 2 beers a week  . Drug use: No  . Sexual activity: Yes    Partners: Female   Other Topics Concern  . Not on file   Social History Narrative  . No narrative on file    No Known Allergies  No family history on file.  Prior to Admission medications   Medication Sig Start Date End Date Taking? Authorizing Provider  aspirin 325 MG tablet Take 325 mg by mouth every 4 (four) hours as needed for mild pain.   Yes Historical Provider, MD  Garlic 10 MG CAPS Take 10 mg by mouth daily.   Yes Historical Provider, MD  ibuprofen (ADVIL,MOTRIN) 200 MG tablet Take 200 mg by mouth every 6 (six) hours as needed for moderate pain.   Yes Historical Provider, MD  magnesium 30 MG tablet Take 30 mg by mouth daily.   Yes Historical Provider, MD  Multiple Vitamin (MULTIVITAMIN) tablet Take 1 tablet by mouth daily.   Yes  Historical Provider, MD  Omega-3 Fatty Acids (FISH OIL) 1000 MG CAPS Take 1,000 mg by mouth daily.   Yes Historical Provider, MD    Physical Exam: Vitals:   09/08/16 1330 09/08/16 1431 09/08/16 1500 09/08/16 1530  BP: 157/87 156/87 148/91 157/85  Pulse: 64 (!) 59 60 (!) 59  Resp: 14 17 19 17   Temp:      SpO2: 94% 94% 93% 92%     General:  Appears calm and comfortable, sitting on the side of the stretcher cooperative Eyes:  PERRL, EOMI, normal lids, iris ENT:  grossly normal hearing, lips & tongue, his membranes of his mouth are pink but slightly dry Neck:  no LAD, masses or  thyromegaly Cardiovascular:  RRR, no m/r/g. No LE edema.  Respiratory:  CTA bilaterally, no w/r/r. Normal respiratory effort. Abdomen:  soft, ntnd, is a bowel sounds throughout Skin:  no rash or induration seen on limited exam Musculoskeletal:  grossly normal tone BUE/BLE, good ROM, no bony abnormality, joints without swelling/erythema Psychiatric:  grossly normal mood and affect, speech fluent and appropriate, AOx3 Neurologic:  CN 2-12 grossly intact, moves all extremities in coordinated fashion, sensation intact, speech clear facial symmetry bilateral grip 5 out of 5 lower extremity strength 5 out of 5  Tongue midline  Labs on Admission: I have personally reviewed following labs and imaging studies  CBC:  Recent Labs Lab 09/08/16 1103  WBC 5.6  NEUTROABS 3.1  HGB 16.0  HCT 46.5  MCV 89.9  PLT 123456*   Basic Metabolic Panel:  Recent Labs Lab 09/08/16 1103  NA 138  K 3.7  CL 102  CO2 26  GLUCOSE 119*  BUN 14  CREATININE 1.18  CALCIUM 9.5   GFR: CrCl cannot be calculated (Unknown ideal weight.). Liver Function Tests:  Recent Labs Lab 09/08/16 1103  AST 26  ALT 16*  ALKPHOS 98  BILITOT 0.6  PROT 6.3*  ALBUMIN 3.6   No results for input(s): LIPASE, AMYLASE in the last 168 hours. No results for input(s): AMMONIA in the last 168 hours. Coagulation Profile: No results for input(s): INR, PROTIME in the last 168 hours. Cardiac Enzymes: No results for input(s): CKTOTAL, CKMB, CKMBINDEX, TROPONINI in the last 168 hours. BNP (last 3 results) No results for input(s): PROBNP in the last 8760 hours. HbA1C: No results for input(s): HGBA1C in the last 72 hours. CBG: No results for input(s): GLUCAP in the last 168 hours. Lipid Profile: No results for input(s): CHOL, HDL, LDLCALC, TRIG, CHOLHDL, LDLDIRECT in the last 72 hours. Thyroid Function Tests: No results for input(s): TSH, T4TOTAL, FREET4, T3FREE, THYROIDAB in the last 72 hours. Anemia Panel: No results for  input(s): VITAMINB12, FOLATE, FERRITIN, TIBC, IRON, RETICCTPCT in the last 72 hours. Urine analysis:    Component Value Date/Time   COLORURINE YELLOW 09/08/2016 1135   APPEARANCEUR CLEAR 09/08/2016 1135   LABSPEC 1.006 09/08/2016 1135   PHURINE 5.0 09/08/2016 1135   GLUCOSEU NEGATIVE 09/08/2016 1135   HGBUR NEGATIVE 09/08/2016 1135   BILIRUBINUR NEGATIVE 09/08/2016 1135   KETONESUR NEGATIVE 09/08/2016 1135   PROTEINUR NEGATIVE 09/08/2016 1135   NITRITE NEGATIVE 09/08/2016 1135   LEUKOCYTESUR SMALL (A) 09/08/2016 1135    Creatinine Clearance: CrCl cannot be calculated (Unknown ideal weight.).  Sepsis Labs: @LABRCNTIP (procalcitonin:4,lacticidven:4) )No results found for this or any previous visit (from the past 240 hour(s)).   Radiological Exams on Admission: Ct Head Wo Contrast  Result Date: 09/08/2016 CLINICAL DATA:  Slurred speech EXAM: CT HEAD WITHOUT CONTRAST TECHNIQUE:  Contiguous axial images were obtained from the base of the skull through the vertex without intravenous contrast. COMPARISON:  11/07/2010 FINDINGS: Brain: No evidence of acute infarction, hemorrhage, hydrocephalus, extra-axial collection or mass lesion/mass effect. Vascular: No hyperdense vessel or unexpected calcification. Skull: Normal. Negative for fracture or focal lesion. Sinuses/Orbits: No acute finding. Other: None. IMPRESSION: No acute abnormality noted. Electronically Signed   By: Inez Catalina M.D.   On: 09/08/2016 11:37    EKG: Independently reviewed Sinus rhythm  Assessment/Plan Principal Problem:   TIA (transient ischemic attack) Active Problems:   Tobacco abuse   Hypertension   Hyperlipidemia   Slurred speech   #1. TIA/slurred speech. Risk factors include tobacco user uncontrolled blood pressure hyperlipidemia family medical history. CT the head without acute abnormality. EKG without acute abnormalities. Initial troponin negative. Symptoms resolved at the point of admission. ED provider reports  discussed with neurology recommends TIA workup -Admit to telemetry -MRI MRA -Carotid Dopplers -2-D echo -Lipid panel and hemoglobin A1c -Continue aspirin -History of several statin's prescribed and patient declined all due to side effects.  -Bedside swallow eval -Heart healthy diet once past -Physical therapy, occupational therapy speech therapy consults -No consult requested per ED  #2 hypertension. History of same. Patient stopped medications due to side effects. Fair control in the emergency department. Patient unable to remember which antihypertensive medication he's been on the past.  -We'll provide Lasix -Monitor blood pressure closely  #3. History of hyperlipidemia. Has tried several medications and declined them all due to side effects -Lipid panel as noted above  #4. Tobacco use. -Cessation counseling offered   DVT prophylaxis: lovenox  Code Status: full  Family Communication: none present  Disposition Plan: home  Consults called: neuro   Admission status: obs    Dyanne Carrel M MD [facility]  If 7PM-7AM, please contact night-coverage [email or web address] Password [facility]  09/08/2016, 4:25 PM

## 2016-09-08 NOTE — ED Notes (Signed)
Pt transported to CT ?

## 2016-09-08 NOTE — ED Triage Notes (Signed)
Per EMS - pt went to bed at 1am this morning. Pt woke up this morning around 0900, reported HTN, warm all over, and "not feeling right in his head." Hx HTN

## 2016-09-08 NOTE — ED Notes (Signed)
Attempted report x1. 

## 2016-09-08 NOTE — ED Provider Notes (Signed)
[facility] DEPT Provider Note   CSN: [id number] Arrival date & time: 09/08/16  1054     History   Chief Complaint Chief Complaint  Patient presents with  . Aphasia    HPI KAEO MCNAIR is a 76 y.o. male.  Level V caveat for urgent need for intervention. Patient went to bed last night feeling normal. 6 AM he awoke and felt that his speech was slurred. His blood pressure was slightly elevated at the time. He talked with daughter on telephone and she agreed that his speech was slightly abnormal. No motor or sensory deficits, facial asymmetry, changes in mentation.  He has not been taking his blood pressure medication.      Past Medical History:  Diagnosis Date  . Hypertension     There are no active problems to display for this patient.   Past Surgical History:  Procedure Laterality Date  . FRACTURE SURGERY  2009   neck       Home Medications    Prior to Admission medications   Medication Sig Start Date End Date Taking? Authorizing Provider  aspirin 325 MG tablet Take 325 mg by mouth every 4 (four) hours as needed for mild pain.   Yes Historical Provider, MD  Garlic 10 MG CAPS Take 10 mg by mouth daily.   Yes Historical Provider, MD  ibuprofen (ADVIL,MOTRIN) 200 MG tablet Take 200 mg by mouth every 6 (six) hours as needed for moderate pain.   Yes Historical Provider, MD  magnesium 30 MG tablet Take 30 mg by mouth daily.   Yes Historical Provider, MD  Multiple Vitamin (MULTIVITAMIN) tablet Take 1 tablet by mouth daily.   Yes Historical Provider, MD  Omega-3 Fatty Acids (FISH OIL) 1000 MG CAPS Take 1,000 mg by mouth daily.   Yes Historical Provider, MD    Family History No family history on file.  Social History Social History  Substance Use Topics  . Smoking status: Former Smoker    Packs/day: 0.25    Years: 40.00    Types: Cigarettes    Quit date: 06/08/2016  . Smokeless tobacco: Never Used  . Alcohol use 0.0 oz/week     Comment: 2 beers a week      Allergies   Patient has no known allergies.   Review of Systems Review of Systems  Reason unable to perform ROS: Urgent need for intervention.     Physical Exam Updated Vital Signs BP 156/87   Pulse (!) 59   Temp 97.5 F (36.4 C)   Resp 17   SpO2 94%   Physical Exam  Constitutional: He is oriented to person, place, and time. He appears well-developed and well-nourished.  HENT:  Head: Normocephalic and atraumatic.  Eyes: Conjunctivae are normal.  Neck: Neck supple.  Cardiovascular: Normal rate and regular rhythm.   Pulmonary/Chest: Effort normal and breath sounds normal.  Abdominal: Soft. Bowel sounds are normal.  Musculoskeletal: Normal range of motion.  Neurological: He is alert and oriented to person, place, and time.  Skin: Skin is warm and dry.  Psychiatric: He has a normal mood and affect. His behavior is normal.  Nursing note and vitals reviewed.    ED Treatments / Results  Labs (all labs ordered are listed, but only abnormal results are displayed) Labs Reviewed  CBC WITH DIFFERENTIAL/PLATELET - Abnormal; Notable for the following:       Result Value   Platelets 149 (*)    All other components within normal limits  COMPREHENSIVE METABOLIC  PANEL - Abnormal; Notable for the following:    Glucose, Bld 119 (*)    Total Protein 6.3 (*)    ALT 16 (*)    GFR calc non Af Amer 59 (*)    All other components within normal limits  URINALYSIS, ROUTINE W REFLEX MICROSCOPIC - Abnormal; Notable for the following:    Leukocytes, UA SMALL (*)    Bacteria, UA RARE (*)    Squamous Epithelial / LPF 0-5 (*)    All other components within normal limits  I-STAT TROPOININ, ED    EKG  EKG Interpretation None       Radiology Ct Head Wo Contrast  Result Date: 09/08/2016 CLINICAL DATA:  Slurred speech EXAM: CT HEAD WITHOUT CONTRAST TECHNIQUE: Contiguous axial images were obtained from the base of the skull through the vertex without intravenous contrast.  COMPARISON:  11/07/2010 FINDINGS: Brain: No evidence of acute infarction, hemorrhage, hydrocephalus, extra-axial collection or mass lesion/mass effect. Vascular: No hyperdense vessel or unexpected calcification. Skull: Normal. Negative for fracture or focal lesion. Sinuses/Orbits: No acute finding. Other: None. IMPRESSION: No acute abnormality noted. Electronically Signed   By: Inez Catalina M.D.   On: 09/08/2016 11:37    Procedures Procedures (including critical care time)  Medications Ordered in ED Medications  sodium chloride 0.9 % bolus 1,000 mL (0 mLs Intravenous Stopped 09/08/16 1245)     Initial Impression / Assessment and Plan / ED Course  I have reviewed the triage vital signs and the nursing notes.  Pertinent labs & imaging results that were available during my care of the patient were reviewed by me and considered in my medical decision making (see chart for details).    Patient presents with a sensation of slurred speech. I did not notice this in the ED. His CT scan was negative. I will obtain an MRI. If this is normal, I believe he can go home. This was discussed with the patient and his daughter.   Final Clinical Impressions(s) / ED Diagnoses   Final diagnoses:  Slurred speech    New Prescriptions New Prescriptions   No medications on file     Nat Christen, MD 09/08/16 1525

## 2016-09-09 ENCOUNTER — Encounter (HOSPITAL_COMMUNITY): Payer: Self-pay | Admitting: *Deleted

## 2016-09-09 ENCOUNTER — Observation Stay (HOSPITAL_BASED_OUTPATIENT_CLINIC_OR_DEPARTMENT_OTHER): Payer: Medicare Other

## 2016-09-09 DIAGNOSIS — G458 Other transient cerebral ischemic attacks and related syndromes: Secondary | ICD-10-CM

## 2016-09-09 DIAGNOSIS — G459 Transient cerebral ischemic attack, unspecified: Secondary | ICD-10-CM

## 2016-09-09 LAB — ECHOCARDIOGRAM COMPLETE
HEIGHTINCHES: 70 in
WEIGHTICAEL: 3158.4 [oz_av]

## 2016-09-09 LAB — HEMOGLOBIN A1C
Hgb A1c MFr Bld: 5.3 % (ref 4.8–5.6)
Mean Plasma Glucose: 105 mg/dL

## 2016-09-09 MED ORDER — FLUTICASONE PROPIONATE 50 MCG/ACT NA SUSP
1.0000 | Freq: Every day | NASAL | Status: DC
  Administered 2016-09-09: 1 via NASAL
  Filled 2016-09-09: qty 16

## 2016-09-09 MED ORDER — HYDROCHLOROTHIAZIDE 25 MG PO TABS: 25.0000 mg | ORAL_TABLET | Freq: Every day | ORAL | 0 refills | Status: DC

## 2016-09-09 MED ORDER — SIMVASTATIN 20 MG PO TABS: 20.0000 mg | ORAL_TABLET | Freq: Every day | ORAL | 0 refills | Status: DC

## 2016-09-09 MED ORDER — ASPIRIN 81 MG PO TBEC: 81.0000 mg | DELAYED_RELEASE_TABLET | Freq: Every day | ORAL | 0 refills | Status: DC

## 2016-09-09 MED ORDER — ASPIRIN EC 81 MG PO TBEC
81.0000 mg | DELAYED_RELEASE_TABLET | Freq: Every day | ORAL | Status: DC
  Administered 2016-09-09: 81 mg via ORAL
  Filled 2016-09-09: qty 1

## 2016-09-09 MED ORDER — SIMVASTATIN 20 MG PO TABS: 20.0000 mg | ORAL_TABLET | Freq: Every day | ORAL | Status: DC

## 2016-09-09 NOTE — Progress Notes (Signed)
OT Cancellation Note  Patient Details Name: Matthew Hubbard MRN: [id number] DOB: [birth year]   Cancelled Treatment:    Reason Eval/Treat Not Completed: OT screened, no needs identified, will sign off. Pt completing ADL independently in room on OT arrival. No fine motor or sensation deficits noted with all tasks. Family present and agree pt is back to baseline for ADL participation. OT will sign off.  [address] OTR/L [id number] 09/09/2016, 1:15 PM

## 2016-09-09 NOTE — Progress Notes (Signed)
PT Cancellation Note  Patient Details Name: Matthew Hubbard MRN: [id number] DOB: Feb 27, 1941   Cancelled Treatment:    Reason Eval/Treat Not Completed: PT screened, no needs identified, will sign off; noted pt checked off by nursing staff as independent.  Daughter in room and agrees with pt back to baseline.  Will sign off    [name] 09/09/2016, 11:37 AM  [name], [place] 09/09/2016

## 2016-09-09 NOTE — Progress Notes (Signed)
*  PRELIMINARY RESULTS* Vascular Ultrasound Carotid Duplex (Doppler) has been completed.  Preliminary findings: Bilateral 1-39% ICA stenosis, minimal plaque, antegrade vertebral flow.   Everrett Coombe 09/09/2016, 9:52 AM

## 2016-09-09 NOTE — Evaluation (Signed)
Speech Language Pathology Evaluation Patient Details Name: Matthew Hubbard MRN: [id number] DOB: 11-04-1940 Today's Date: 09/09/2016 Time: 1045-1100 SLP Time Calculation (min) (ACUTE ONLY): 15 min  Problem List:  Patient Active Problem List   Diagnosis Date Noted  . Hypertension 09/08/2016  . Hyperlipidemia 09/08/2016  . Slurred speech 09/08/2016  . Tobacco abuse   . TIA (transient ischemic attack)    Past Medical History:  Past Medical History:  Diagnosis Date  . Bradycardia   . Hypertension   . Non-compliance   . Slurred speech   . TIA (transient ischemic attack)   . Tobacco abuse    Past Surgical History:  Past Surgical History:  Procedure Laterality Date  . FRACTURE SURGERY  2009   neck   HPI:  76 year old male admitted 09/08/16 due to slurred speech/TIA. PMH significant for HTN, HLD, medication noncompliance, tobacco abuse. CT negative, MRI revealed mild chronic microvascular ischemia without acute abnormality.    Assessment / Plan / Recommendation Clinical Impression  The Montreal Cognitive Assessment (MoCA) was administered. Pt scored 29/30 (n=26+/30) which falls within normal limits for this assessment and pt level of education (1 year college). No further ST intervention recommended at this time, however, pt was encouraged to notify MD if issues arise once pt returns to normal routine.     SLP Assessment  Patient does not need any further Speech Language Pathology Services    Follow Up Recommendations  None    Frequency and Duration   n/a        SLP Evaluation Cognition  Overall Cognitive Status: Within Functional Limits for tasks assessed Arousal/Alertness: Awake/alert Orientation Level: Oriented X4 Attention: Focused;Sustained Focused Attention: Appears intact Sustained Attention: Appears intact Memory: Appears intact Awareness: Appears intact Problem Solving: Appears intact Executive Function: Reasoning;Sequencing;Organizing Reasoning: Appears  intact Sequencing: Appears intact Organizing: Appears intact Safety/Judgment: Appears intact       Comprehension  Auditory Comprehension Overall Auditory Comprehension: Appears within functional limits for tasks assessed    Expression Expression Primary Mode of Expression: Verbal Verbal Expression Overall Verbal Expression: Appears within functional limits for tasks assessed   Oral / Motor  Oral Motor/Sensory Function Overall Oral Motor/Sensory Function: Within functional limits Motor Speech Overall Motor Speech: Appears within functional limits for tasks assessed   GO          Functional Assessment Tool Used: asha noms, clinical judgment, MoCA Functional Limitations: Memory Memory Current Status [id number]): At least 1 percent but less than 20 percent impaired, limited or restricted Memory Goal Status [id number]): At least 1 percent but less than 20 percent impaired, limited or restricted Memory Discharge Status [phone]): At least 1 percent but less than 20 percent impaired, limited or restricted         Matthew Hubbard [facility], CCC-SLP [id number]  Shonna Chock 09/09/2016, 11:07 AM

## 2016-09-09 NOTE — Discharge Summary (Signed)
Physician Discharge Summary  Matthew Hubbard Matthew Hubbard WER:[id number] DOB: [birth year] DOA: 09/08/2016  PCP: Simona Huh, MD  Admit date: 09/08/2016 Discharge date: 09/09/2016  Admitted From: Home  Disposition: Home   Recommendations for Outpatient Follow-up:  1. Follow up with PCP in 1-2 weeks 2. Please obtain BMP/CBC in one week 3. Further risk factors modification to prevent stroke.    Discharge Condition: Stable.  CODE STATUS:* Full Code.  Diet recommendation: Heart Healthy   Brief/Interim Summary: 1-TIA; Patient presents with slurred speech and tremors of left hand. MRI negative for stroke. ECHO normal Ef. Carotid doppler no significant ICA stenosis. LDL elevated. He will be started on statins. BP needs better controlled. He was not taking his BP medications. I have prescribe HCTZ. Needs B-met at follow up. He will be started on aspirin. He has not been taking aspirin. His symptoms has resolved.     Discharge Diagnoses:  Principal Problem:   TIA (transient ischemic attack) Active Problems:   Tobacco abuse   Hypertension   Hyperlipidemia   Slurred speech    Discharge Instructions  Discharge Instructions    Diet - low sodium heart healthy    Complete by:  As directed    Increase activity slowly    Complete by:  As directed      Allergies as of 09/09/2016   No Known Allergies     Medication List    STOP taking these medications   aspirin 325 MG tablet Replaced by:  aspirin 81 MG EC tablet   ibuprofen 200 MG tablet Commonly known as:  ADVIL,MOTRIN     TAKE these medications   aspirin 81 MG EC tablet Take 1 tablet (81 mg total) by mouth daily. Replaces:  aspirin 325 MG tablet   Fish Oil 1000 MG Caps Take 1,000 mg by mouth daily.   Garlic 10 MG Caps Take 10 mg by mouth daily.   hydrochlorothiazide 25 MG tablet Commonly known as:  HYDRODIURIL Take 1 tablet (25 mg total) by mouth daily.   magnesium 30 MG tablet Take 30 mg by mouth daily.    multivitamin tablet Take 1 tablet by mouth daily.   simvastatin 20 MG tablet Commonly known as:  ZOCOR Take 1 tablet (20 mg total) by mouth daily at 6 PM.       No Known Allergies  Consultations: none Procedures/Studies: Dg Chest 2 View  Result Date: 09/08/2016 CLINICAL DATA:  Cough and hypertension EXAM: CHEST  2 VIEW COMPARISON:  February 10, 2008. FINDINGS: Lungs are clear. Heart size and pulmonary vascularity are normal. No adenopathy. There is atherosclerotic calcification in the aorta. No bone lesions. There is postoperative change in the lower cervical spine. IMPRESSION: No edema or consolidation.  There is aortic atherosclerosis. Electronically Signed   By: Lowella Grip III M.D.   On: 09/08/2016 16:49   Ct Head Wo Contrast  Result Date: 09/08/2016 CLINICAL DATA:  Slurred speech EXAM: CT HEAD WITHOUT CONTRAST TECHNIQUE: Contiguous axial images were obtained from the base of the skull through the vertex without intravenous contrast. COMPARISON:  11/07/2010 FINDINGS: Brain: No evidence of acute infarction, hemorrhage, hydrocephalus, extra-axial collection or mass lesion/mass effect. Vascular: No hyperdense vessel or unexpected calcification. Skull: Normal. Negative for fracture or focal lesion. Sinuses/Orbits: No acute finding. Other: None. IMPRESSION: No acute abnormality noted. Electronically Signed   By: Inez Catalina M.D.   On: 09/08/2016 11:37   Mr Brain Wo Contrast  Result Date: 09/08/2016 CLINICAL DATA:  Slurred speech EXAM: MRI HEAD WITHOUT  CONTRAST MRA HEAD WITHOUT CONTRAST TECHNIQUE: Multiplanar, multiecho pulse sequences of the brain and surrounding structures were obtained without intravenous contrast. Angiographic images of the head were obtained using MRA technique without contrast. COMPARISON:  Head CT 09/08/2016 FINDINGS: MRI HEAD FINDINGS Brain: No focal diffusion restriction to indicate acute infarct. No intraparenchymal hemorrhage. There is mild hyperintense  T2-weighted signal within the periventricular white matter, most often seen in the setting of chronic microvascular ischemia. No mass lesion or midline shift. No hydrocephalus or extra-axial fluid collection. The midline structures are normal. No age advanced or lobar predominant atrophy. Vascular: Major intracranial arterial and venous sinus flow voids are preserved. No evidence of chronic microhemorrhage or amyloid angiopathy. Skull and upper cervical spine: The visualized skull base, calvarium, upper cervical spine and extracranial soft tissues are normal. Sinuses/Orbits: No fluid levels or advanced mucosal thickening. No mastoid effusion. Normal orbits. MRA HEAD FINDINGS Intracranial internal carotid arteries: Normal. Anterior cerebral arteries: Normal. Middle cerebral arteries: Normal. Apparent narrowing of a branch of the right MCA superior division on the MIP images is not confirmed by the source data. Posterior communicating arteries: Present bilaterally. Posterior cerebral arteries: Normal. Basilar artery: Normal. Vertebral arteries: Left dominant. Normal. Superior cerebellar arteries: Normal. Anterior inferior cerebellar arteries: Normal. Posterior inferior cerebellar arteries: Normal. IMPRESSION: 1. Findings of mild chronic microvascular ischemia without acute intracranial abnormality. 2. Normal intracranial MRA. Electronically Signed   By: Ulyses Jarred M.D.   On: 09/08/2016 20:25   Mr [name] Head/brain QZ Cm  Result Date: 09/08/2016 CLINICAL DATA:  Slurred speech EXAM: MRI HEAD WITHOUT CONTRAST MRA HEAD WITHOUT CONTRAST TECHNIQUE: Multiplanar, multiecho pulse sequences of the brain and surrounding structures were obtained without intravenous contrast. Angiographic images of the head were obtained using MRA technique without contrast. COMPARISON:  Head CT 09/08/2016 FINDINGS: MRI HEAD FINDINGS Brain: No focal diffusion restriction to indicate acute infarct. No intraparenchymal hemorrhage. There is mild  hyperintense T2-weighted signal within the periventricular white matter, most often seen in the setting of chronic microvascular ischemia. No mass lesion or midline shift. No hydrocephalus or extra-axial fluid collection. The midline structures are normal. No age advanced or lobar predominant atrophy. Vascular: Major intracranial arterial and venous sinus flow voids are preserved. No evidence of chronic microhemorrhage or amyloid angiopathy. Skull and upper cervical spine: The visualized skull base, calvarium, upper cervical spine and extracranial soft tissues are normal. Sinuses/Orbits: No fluid levels or advanced mucosal thickening. No mastoid effusion. Normal orbits. MRA HEAD FINDINGS Intracranial internal carotid arteries: Normal. Anterior cerebral arteries: Normal. Middle cerebral arteries: Normal. Apparent narrowing of a branch of the right MCA superior division on the MIP images is not confirmed by the source data. Posterior communicating arteries: Present bilaterally. Posterior cerebral arteries: Normal. Basilar artery: Normal. Vertebral arteries: Left dominant. Normal. Superior cerebellar arteries: Normal. Anterior inferior cerebellar arteries: Normal. Posterior inferior cerebellar arteries: Normal. IMPRESSION: 1. Findings of mild chronic microvascular ischemia without acute intracranial abnormality. 2. Normal intracranial MRA. Electronically Signed   By: Ulyses Jarred M.D.   On: 09/08/2016 20:25    (Echo, Carotid, EGD, Colonoscopy, ERCP)    Subjective:   Discharge Exam: Vitals:   09/09/16 0600 09/09/16 1014  BP: 129/74 (!) 143/90  Pulse: (!) 58 60  Resp: 18 18  Temp: 98 F (36.7 C) 98.3 F (36.8 C)   Vitals:   09/09/16 0200 09/09/16 0400 09/09/16 0600 09/09/16 1014  BP: (!) 153/93 (!) 140/93 129/74 (!) 143/90  Pulse: (!) 54 (!) 55 (!) 58 60  Resp:  18 18 18 18   Temp:   98 F (36.7 C) 98.3 F (36.8 C)  TempSrc:   Oral Oral  SpO2: 96% 95% 96% 97%  Weight:      Height:         General: Pt is alert, awake, not in acute distress Cardiovascular: RRR, S1/S2 +, no rubs, no gallops Respiratory: CTA bilaterally, no wheezing, no rhonchi Abdominal: Soft, NT, ND, bowel sounds + Extremities: no edema, no cyanosis    The results of significant diagnostics from this hospitalization (including imaging, microbiology, ancillary and laboratory) are listed below for reference.     Microbiology: No results found for this or any previous visit (from the past 240 hour(s)).   Labs: BNP (last 3 results) No results for input(s): BNP in the last 8760 hours. Basic Metabolic Panel:  Recent Labs Lab 09/08/16 1103  NA 138  K 3.7  CL 102  CO2 26  GLUCOSE 119*  BUN 14  CREATININE 1.18  CALCIUM 9.5   Liver Function Tests:  Recent Labs Lab 09/08/16 1103  AST 26  ALT 16*  ALKPHOS 98  BILITOT 0.6  PROT 6.3*  ALBUMIN 3.6   No results for input(s): LIPASE, AMYLASE in the last 168 hours. No results for input(s): AMMONIA in the last 168 hours. CBC:  Recent Labs Lab 09/08/16 1103  WBC 5.6  NEUTROABS 3.1  HGB 16.0  HCT 46.5  MCV 89.9  PLT 149*   Cardiac Enzymes: No results for input(s): CKTOTAL, CKMB, CKMBINDEX, TROPONINI in the last 168 hours. BNP: Invalid input(s): POCBNP CBG: No results for input(s): GLUCAP in the last 168 hours. D-Dimer No results for input(s): DDIMER in the last 72 hours. Hgb A1c  Recent Labs  09/08/16 1700  HGBA1C 5.3   Lipid Profile  Recent Labs  09/08/16 1700  CHOL 227*  HDL 66  LDLCALC 147*  TRIG 70  CHOLHDL 3.4   Thyroid function studies No results for input(s): TSH, T4TOTAL, T3FREE, THYROIDAB in the last 72 hours.  Invalid input(s): FREET3 Anemia work up No results for input(s): VITAMINB12, FOLATE, FERRITIN, TIBC, IRON, RETICCTPCT in the last 72 hours. Urinalysis    Component Value Date/Time   COLORURINE YELLOW 09/08/2016 1135   APPEARANCEUR CLEAR 09/08/2016 1135   LABSPEC 1.006 09/08/2016 1135    PHURINE 5.0 09/08/2016 1135   GLUCOSEU NEGATIVE 09/08/2016 1135   HGBUR NEGATIVE 09/08/2016 [place] 09/08/2016 1135   KETONESUR NEGATIVE 09/08/2016 1135   PROTEINUR NEGATIVE 09/08/2016 1135   NITRITE NEGATIVE 09/08/2016 1135   LEUKOCYTESUR SMALL (A) 09/08/2016 1135   Sepsis Labs Invalid input(s): PROCALCITONIN,  WBC,  LACTICIDVEN Microbiology No results found for this or any previous visit (from the past 240 hour(s)).   Time coordinating discharge: Over 30 minutes  SIGNED:   Elmarie Shiley, MD  [facility] 09/09/2016, 12:30 PM Pager   If 7PM-7AM, please contact night-coverage [email or web address] Password [email or web address]

## 2016-09-09 NOTE — Progress Notes (Signed)
Pt. W/ discharge orders. Discharge instructions given with patient and his daughter at this time, both verbalized understanding. Discharged via wheelchair.

## 2016-09-09 NOTE — Progress Notes (Signed)
  Echocardiogram 2D Echocardiogram has been performed.  Matthew Hubbard 09/09/2016, 9:16 AM

## 2016-09-10 LAB — VAS US CAROTID
LCCAPDIAS: 19 cm/s
LCCAPSYS: 105 cm/s
LEFT ECA DIAS: -25 cm/s
LEFT VERTEBRAL DIAS: -16 cm/s
LICADDIAS: -14 cm/s
LICAPSYS: -76 cm/s
Left CCA dist dias: -14 cm/s
Left CCA dist sys: -62 cm/s
Left ICA dist sys: -73 cm/s
Left ICA prox dias: -20 cm/s
RCCADSYS: -81 cm/s
RIGHT ECA DIAS: -27 cm/s
RIGHT VERTEBRAL DIAS: 8 cm/s
Right CCA prox dias: 17 cm/s
Right CCA prox sys: 84 cm/s

## 2016-09-18 DIAGNOSIS — D696 Thrombocytopenia, unspecified: Secondary | ICD-10-CM | POA: Diagnosis not present

## 2016-09-18 DIAGNOSIS — E7800 Pure hypercholesterolemia, unspecified: Secondary | ICD-10-CM | POA: Diagnosis not present

## 2016-09-18 DIAGNOSIS — G459 Transient cerebral ischemic attack, unspecified: Secondary | ICD-10-CM | POA: Diagnosis not present

## 2016-09-18 DIAGNOSIS — I10 Essential (primary) hypertension: Secondary | ICD-10-CM | POA: Diagnosis not present

## 2016-09-18 DIAGNOSIS — R7309 Other abnormal glucose: Secondary | ICD-10-CM | POA: Diagnosis not present

## 2016-12-07 DIAGNOSIS — R05 Cough: Secondary | ICD-10-CM | POA: Diagnosis not present

## 2016-12-07 DIAGNOSIS — Z23 Encounter for immunization: Secondary | ICD-10-CM | POA: Diagnosis not present

## 2016-12-07 DIAGNOSIS — J9801 Acute bronchospasm: Secondary | ICD-10-CM | POA: Diagnosis not present

## 2017-01-04 ENCOUNTER — Other Ambulatory Visit: Payer: Self-pay | Admitting: Family Medicine

## 2017-01-04 ENCOUNTER — Ambulatory Visit
Admission: RE | Admit: 2017-01-04 | Discharge: 2017-01-04 | Disposition: A | Discharge status: Home / Self Care | Payer: Medicare Other | Source: Ambulatory Visit | Attending: Family Medicine | Admitting: Family Medicine

## 2017-01-04 DIAGNOSIS — R05 Cough: Secondary | ICD-10-CM

## 2017-01-04 DIAGNOSIS — R059 Cough, unspecified: Secondary | ICD-10-CM

## 2017-01-04 DIAGNOSIS — J069 Acute upper respiratory infection, unspecified: Secondary | ICD-10-CM | POA: Diagnosis not present

## 2017-02-19 DIAGNOSIS — I10 Essential (primary) hypertension: Secondary | ICD-10-CM | POA: Diagnosis not present

## 2017-03-15 DIAGNOSIS — M542 Cervicalgia: Secondary | ICD-10-CM | POA: Diagnosis not present

## 2017-04-08 DIAGNOSIS — I10 Essential (primary) hypertension: Secondary | ICD-10-CM | POA: Diagnosis not present

## 2017-04-08 DIAGNOSIS — E7800 Pure hypercholesterolemia, unspecified: Secondary | ICD-10-CM | POA: Diagnosis not present

## 2017-04-15 DIAGNOSIS — M4722 Other spondylosis with radiculopathy, cervical region: Secondary | ICD-10-CM | POA: Diagnosis not present

## 2017-04-29 DIAGNOSIS — M4726 Other spondylosis with radiculopathy, lumbar region: Secondary | ICD-10-CM | POA: Diagnosis not present

## 2017-04-29 DIAGNOSIS — M4722 Other spondylosis with radiculopathy, cervical region: Secondary | ICD-10-CM | POA: Diagnosis not present

## 2017-04-29 DIAGNOSIS — M48061 Spinal stenosis, lumbar region without neurogenic claudication: Secondary | ICD-10-CM | POA: Diagnosis not present

## 2017-04-29 DIAGNOSIS — M4802 Spinal stenosis, cervical region: Secondary | ICD-10-CM | POA: Diagnosis not present

## 2017-05-03 DIAGNOSIS — M4722 Other spondylosis with radiculopathy, cervical region: Secondary | ICD-10-CM | POA: Diagnosis not present

## 2017-05-03 DIAGNOSIS — M4726 Other spondylosis with radiculopathy, lumbar region: Secondary | ICD-10-CM | POA: Diagnosis not present

## 2017-05-03 DIAGNOSIS — I10 Essential (primary) hypertension: Secondary | ICD-10-CM | POA: Diagnosis not present

## 2017-05-03 DIAGNOSIS — Z6826 Body mass index (BMI) 26.0-26.9, adult: Secondary | ICD-10-CM | POA: Diagnosis not present

## 2017-05-20 DIAGNOSIS — M4800 Spinal stenosis, site unspecified: Secondary | ICD-10-CM | POA: Insufficient documentation

## 2017-05-20 DIAGNOSIS — M9981 Other biomechanical lesions of cervical region: Secondary | ICD-10-CM | POA: Diagnosis not present

## 2017-05-20 DIAGNOSIS — M47812 Spondylosis without myelopathy or radiculopathy, cervical region: Secondary | ICD-10-CM | POA: Insufficient documentation

## 2017-05-20 DIAGNOSIS — M5412 Radiculopathy, cervical region: Secondary | ICD-10-CM | POA: Diagnosis not present

## 2017-05-20 DIAGNOSIS — M47892 Other spondylosis, cervical region: Secondary | ICD-10-CM | POA: Diagnosis not present

## 2017-05-25 DIAGNOSIS — M47812 Spondylosis without myelopathy or radiculopathy, cervical region: Secondary | ICD-10-CM | POA: Diagnosis not present

## 2017-09-23 DIAGNOSIS — D170 Benign lipomatous neoplasm of skin and subcutaneous tissue of head, face and neck: Secondary | ICD-10-CM | POA: Diagnosis not present

## 2017-09-23 DIAGNOSIS — K429 Umbilical hernia without obstruction or gangrene: Secondary | ICD-10-CM | POA: Diagnosis not present

## 2017-09-28 DIAGNOSIS — H6123 Impacted cerumen, bilateral: Secondary | ICD-10-CM | POA: Diagnosis not present

## 2017-09-28 DIAGNOSIS — H6993 Unspecified Eustachian tube disorder, bilateral: Secondary | ICD-10-CM | POA: Insufficient documentation

## 2017-09-28 DIAGNOSIS — H9313 Tinnitus, bilateral: Secondary | ICD-10-CM | POA: Insufficient documentation

## 2017-09-28 DIAGNOSIS — H9193 Unspecified hearing loss, bilateral: Secondary | ICD-10-CM | POA: Insufficient documentation

## 2017-09-28 DIAGNOSIS — Z87891 Personal history of nicotine dependence: Secondary | ICD-10-CM | POA: Diagnosis not present

## 2017-09-28 DIAGNOSIS — H6983 Other specified disorders of Eustachian tube, bilateral: Secondary | ICD-10-CM | POA: Diagnosis not present

## 2017-09-28 DIAGNOSIS — Z7289 Other problems related to lifestyle: Secondary | ICD-10-CM | POA: Diagnosis not present

## 2017-09-28 DIAGNOSIS — R0981 Nasal congestion: Secondary | ICD-10-CM | POA: Diagnosis not present

## 2017-10-12 DIAGNOSIS — I10 Essential (primary) hypertension: Secondary | ICD-10-CM | POA: Diagnosis not present

## 2017-10-12 DIAGNOSIS — E7800 Pure hypercholesterolemia, unspecified: Secondary | ICD-10-CM | POA: Diagnosis not present

## 2017-10-14 ENCOUNTER — Other Ambulatory Visit: Payer: Self-pay

## 2017-10-14 ENCOUNTER — Encounter (HOSPITAL_COMMUNITY): Payer: Self-pay | Admitting: Emergency Medicine

## 2017-10-14 DIAGNOSIS — Z79899 Other long term (current) drug therapy: Secondary | ICD-10-CM | POA: Diagnosis not present

## 2017-10-14 DIAGNOSIS — Z87891 Personal history of nicotine dependence: Secondary | ICD-10-CM | POA: Insufficient documentation

## 2017-10-14 DIAGNOSIS — Z7982 Long term (current) use of aspirin: Secondary | ICD-10-CM | POA: Insufficient documentation

## 2017-10-14 DIAGNOSIS — I10 Essential (primary) hypertension: Secondary | ICD-10-CM | POA: Diagnosis not present

## 2017-10-14 NOTE — ED Triage Notes (Signed)
Pt reports his blood pressure is "been bouncing all around", has been very elevated.  Pt states he was on medication but went off it months ago and started taking his own medication regiment.

## 2017-10-15 ENCOUNTER — Emergency Department (HOSPITAL_COMMUNITY)
Admission: EM | Admit: 2017-10-15 | Discharge: 2017-10-15 | Disposition: A | Discharge status: Home / Self Care | Payer: Medicare Other | Attending: Emergency Medicine | Admitting: Emergency Medicine

## 2017-10-15 DIAGNOSIS — I10 Essential (primary) hypertension: Secondary | ICD-10-CM

## 2017-10-15 MED ORDER — LISINOPRIL 10 MG PO TABS: 10.0000 mg | ORAL_TABLET | Freq: Every day | ORAL | 0 refills | Status: DC

## 2017-10-15 NOTE — Discharge Instructions (Addendum)
Continue to monitor your blood pressure daily.  Keep a record of it, and take it with you when you see your primary care provider.  It may be several weeks before you see the effects of the new medication on your blood pressure.

## 2017-10-15 NOTE — ED Provider Notes (Addendum)
[facility] EMERGENCY DEPARTMENT Provider Note   CSN: [id number] Arrival date & time: 10/14/17  2140     History   Chief Complaint Chief Complaint  Patient presents with  . Hypertension    HPI Matthew Hubbard is a 77 y.o. male.  The history is provided by the patient.  Hypertension   He has a history of hypertension, hyperlipidemia, transient ischemic attack and comes in because of persistent high blood pressure over the last 2 weeks.  He had stopped his blood pressure medication about 6 months ago because of side effects.  He had been taking metoprolol.  He states that he was checking his blood pressure regularly and it had been doing well until 2 weeks ago when it started running consistently 322-025 systolic.  He denies headache, visual change, nosebleeds.  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  He denies chest pain, heaviness, tightness, pressure.  He does complain of a full feeling in his left axilla and states that his left arm will sometimes go numb.  He is concerned that his arm symptoms may be related to prior neck surgery.  Past Medical History:  Diagnosis Date  . Bradycardia   . Hypertension   . Non-compliance   . Slurred speech   . TIA (transient ischemic attack)   . Tobacco abuse     Patient Active Problem List   Diagnosis Date Noted  . Hypertension 09/08/2016  . Hyperlipidemia 09/08/2016  . Slurred speech 09/08/2016  . Tobacco abuse   . TIA (transient ischemic attack)     Past Surgical History:  Procedure Laterality Date  . FRACTURE SURGERY  2009   neck       Home Medications    Prior to Admission medications   Medication Sig Start Date End Date Taking? Authorizing Provider  aspirin EC 81 MG EC tablet Take 1 tablet (81 mg total) by mouth daily. 09/09/16   Regalado, Belkys A, MD  Garlic 10 MG CAPS Take 10 mg by mouth daily.    [provider]  hydrochlorothiazide (HYDRODIURIL) 25 MG tablet  Take 1 tablet (25 mg total) by mouth daily. 09/09/16   Regalado, Belkys A, MD  magnesium 30 MG tablet Take 30 mg by mouth daily.    [provider]  Multiple Vitamin (MULTIVITAMIN) tablet Take 1 tablet by mouth daily.    [provider]  Omega-3 Fatty Acids (FISH OIL) 1000 MG CAPS Take 1,000 mg by mouth daily.    [provider]  simvastatin (ZOCOR) 20 MG tablet Take 1 tablet (20 mg total) by mouth daily at 6 PM. 09/09/16   Regalado, Cassie Freer, MD    Family History No family history on file.  Social History Social History   Tobacco Use  . Smoking status: Former Smoker    Packs/day: 0.25    Years: 40.00    Pack years: 10.00    Types: Cigarettes    Last attempt to quit: 06/08/2016    Years since quitting: 1.3  . Smokeless tobacco: Never Used  Substance Use Topics  . Alcohol use: Yes    Alcohol/week: 0.0 oz    Comment: 2 beers a week  . Drug use: No     Allergies   Patient has no known allergies.   Review of Systems Review of Systems  All other systems reviewed and are negative.    Physical Exam Updated Vital Signs BP (!) 173/92   Pulse (!) 50  Temp 97.9 F (36.6 C)   Resp 17   Ht 5\' 10"  (1.778 m)   Wt 83.9 kg (185 lb)   SpO2 97%   BMI 26.54 kg/m   Physical Exam  Nursing note and vitals reviewed.  77 year old male, resting comfortably and in no acute distress. Vital signs are significant for elevated blood pressure and bradycardia. Oxygen saturation is 97%, which is normal. Head is normocephalic and atraumatic. PERRLA, EOMI. Oropharynx is clear. Neck is nontender and supple without adenopathy or JVD. Back is nontender and there is no CVA tenderness. Lungs are clear without rales, wheezes, or rhonchi. Chest is nontender. Heart has regular rate and rhythm without murmur. Abdomen is soft, flat, nontender without masses or hepatosplenomegaly and peristalsis is normoactive. Extremities have no cyanosis or edema, full range of motion is  present.  Strong radial pulse present bilaterally.  Capillary refill is prompt. Skin is warm and dry without rash. Neurologic: Mental status is normal, cranial nerves are intact, there are no motor or sensory deficits.  Strength is 5/5 in all muscle groups of the left arm.  Careful sensory exam is completely normal.  ED Treatments / Results   Procedures Procedures (including critical care time)  Medications Ordered in ED Medications - No data to display   Initial Impression / Assessment and Plan / ED Course  I have reviewed the triage vital signs and the nursing notes.  Essential hypertension.  Old records are reviewed, and he had been admitted for transient ischemic attack one year ago.  He had been given a prescription for hydrochlorothiazide, but that prescription was never filled.  At this point, I do feel he will need to be on ongoing medication for his blood pressure.  He is resistant ongoing on hydrochlorothiazide because he does not have any fluid retention.  I did explain to him that there is effect on blood pressure separate from effect on fluids, but he still is resistant on taking that.  We will discharged with prescription for lisinopril and advised to follow-up with PCP in 2 weeks.  Encouraged to keep a log of his blood pressures.  Return precautions discussed.  Final Clinical Impressions(s) / ED Diagnoses   Final diagnoses:  Essential hypertension    ED Discharge Orders        Ordered    lisinopril (PRINIVIL,ZESTRIL) 10 MG tablet  Daily     42/68/34 1962       Delora Fuel, MD 22/97/98 [id number]    Delora Fuel, MD 94/17/40 [phone]

## 2017-11-05 DIAGNOSIS — I10 Essential (primary) hypertension: Secondary | ICD-10-CM | POA: Diagnosis not present

## 2017-11-05 DIAGNOSIS — R6889 Other general symptoms and signs: Secondary | ICD-10-CM | POA: Diagnosis not present

## 2018-03-03 ENCOUNTER — Encounter (HOSPITAL_COMMUNITY): Payer: Self-pay | Admitting: Emergency Medicine

## 2018-03-03 ENCOUNTER — Ambulatory Visit (HOSPITAL_COMMUNITY)
Admission: EM | Admit: 2018-03-03 | Discharge: 2018-03-03 | Disposition: A | Discharge status: Home / Self Care | Payer: Medicare Other | Attending: Family Medicine | Admitting: Family Medicine

## 2018-03-03 ENCOUNTER — Other Ambulatory Visit: Payer: Self-pay

## 2018-03-03 DIAGNOSIS — I878 Other specified disorders of veins: Secondary | ICD-10-CM | POA: Diagnosis not present

## 2018-03-03 DIAGNOSIS — Z711 Person with feared health complaint in whom no diagnosis is made: Secondary | ICD-10-CM

## 2018-03-03 NOTE — Discharge Instructions (Signed)
Conspicuous hand veins are actually normal veins.  Large bulging veins on the back of our hands occur because our skin relaxes and thins Follow up with PCP as needed Return or go to the ED if you have any new or worsening symptoms

## 2018-03-03 NOTE — ED Triage Notes (Signed)
Patient noticed today the veins in right hand bulge and when he puts hand above head veins disappear.  Patient has not noticed this before

## 2018-03-03 NOTE — ED Provider Notes (Signed)
[place]   [id number] 03/03/18 Arrival Time: 1949  SUBJECTIVE:  Matthew Hubbard is a 77 y.o. male who presents with prominent veins localized to the back of his right hand.  States he first noticed them today while driving.  Denies pain.  Improved with raising hand overhead.  Denies fever, chills, nausea, vomiting, SOB, chest pain, dizziness, light-headedness, abdominal pain, changes in bowel or bladder function.    ROS: As per HPI.  Past Medical History:  Diagnosis Date  . Bradycardia   . Hypertension   . Non-compliance   . Slurred speech   . TIA (transient ischemic attack)   . Tobacco abuse    Past Surgical History:  Procedure Laterality Date  . FRACTURE SURGERY  2009   neck   No Known Allergies No current facility-administered medications on file prior to encounter.    Current Outpatient Medications on File Prior to Encounter  Medication Sig Dispense Refill  . co-enzyme Q-10 30 MG capsule Take 30 mg by mouth 3 (three) times daily.    . metoprolol tartrate (LOPRESSOR) 25 MG tablet Take 25 mg by mouth 2 (two) times daily.    Marland Kitchen POTASSIUM CHLORIDE ER PO Take by mouth.    . magnesium 30 MG tablet Take 30 mg by mouth daily.    . Multiple Vitamin (MULTIVITAMIN) tablet Take 1 tablet by mouth daily.    . Omega-3 Fatty Acids (FISH OIL) 1000 MG CAPS Take 1,000 mg by mouth daily.     Social History   Socioeconomic History  . Marital status: Married    Spouse name: Not on file  . Number of children: Not on file  . Years of education: Not on file  . Highest education level: Not on file  Occupational History  . Not on file  Social Needs  . Financial resource strain: Not on file  . Food insecurity:    Worry: Not on file    Inability: Not on file  . Transportation needs:    Medical: Not on file    Non-medical: Not on file  Tobacco Use  . Smoking status: Former Smoker    Packs/day: 0.25    Years: 40.00    Pack years: 10.00    Types: Cigarettes    Last  attempt to quit: 06/08/2016    Years since quitting: 1.7  . Smokeless tobacco: Never Used  Substance and Sexual Activity  . Alcohol use: Yes    Alcohol/week: 0.0 standard drinks    Comment: 2 beers a week  . Drug use: No  . Sexual activity: Yes    Partners: Female  Lifestyle  . Physical activity:    Days per week: Not on file    Minutes per session: Not on file  . Stress: Not on file  Relationships  . Social connections:    Talks on phone: Not on file    Gets together: Not on file    Attends religious service: Not on file    Active member of club or organization: Not on file    Attends meetings of clubs or organizations: Not on file    Relationship status: Not on file  . Intimate partner violence:    Fear of current or ex partner: Not on file    Emotionally abused: Not on file    Physically abused: Not on file    Forced sexual activity: Not on file  Other Topics Concern  . Not on file  Social History Narrative  . Not  on file   History reviewed. No pertinent family history.  OBJECTIVE: Vitals:   03/03/18 1951  BP: (!) 147/86  Pulse: 80  Resp: 16  Temp: 97.9 F (36.6 C)  TempSrc: Oral  SpO2: 95%    General appearance: alert; no distress Lungs: clear to auscultation bilaterally Heart: regular rate and rhythm.  Radial pulse 2+ bilaterally Extremities: no edema Skin: warm and dry; prominent veins to the dorsal aspect of right hand; nontender to palpation; no obvious erythema or swelling Psychological: alert and cooperative; normal mood and affect  ASSESSMENT & PLAN:  1. Physically well but worried    No orders of the defined types were placed in this encounter.  Conspicuous hand veins are actually normal veins.  Large bulging veins on the back of our hands occur because our skin relaxes and thins Follow up with PCP as needed Return or go to the ED if you have any new or worsening symptoms  Reviewed expectations re: course of current medical issues. Questions  answered. Outlined signs and symptoms indicating need for more acute intervention. Patient verbalized understanding. After Visit Summary given.   Lestine Box, PA-C 03/03/18 2018

## 2018-03-07 DIAGNOSIS — K439 Ventral hernia without obstruction or gangrene: Secondary | ICD-10-CM | POA: Diagnosis not present

## 2018-03-07 DIAGNOSIS — K4090 Unilateral inguinal hernia, without obstruction or gangrene, not specified as recurrent: Secondary | ICD-10-CM | POA: Diagnosis not present

## 2018-03-07 DIAGNOSIS — K30 Functional dyspepsia: Secondary | ICD-10-CM | POA: Diagnosis not present

## 2018-03-21 DIAGNOSIS — K641 Second degree hemorrhoids: Secondary | ICD-10-CM | POA: Diagnosis not present

## 2018-04-06 IMAGING — CR DG CHEST 2V
2 series · 2 of 2 positions shown · non-contrast
Comparison: 09/08/2016 chest radiograph.

CLINICAL DATA: Productive cough for several weeks

EXAM:
CHEST  2 VIEW

[w chest pa]
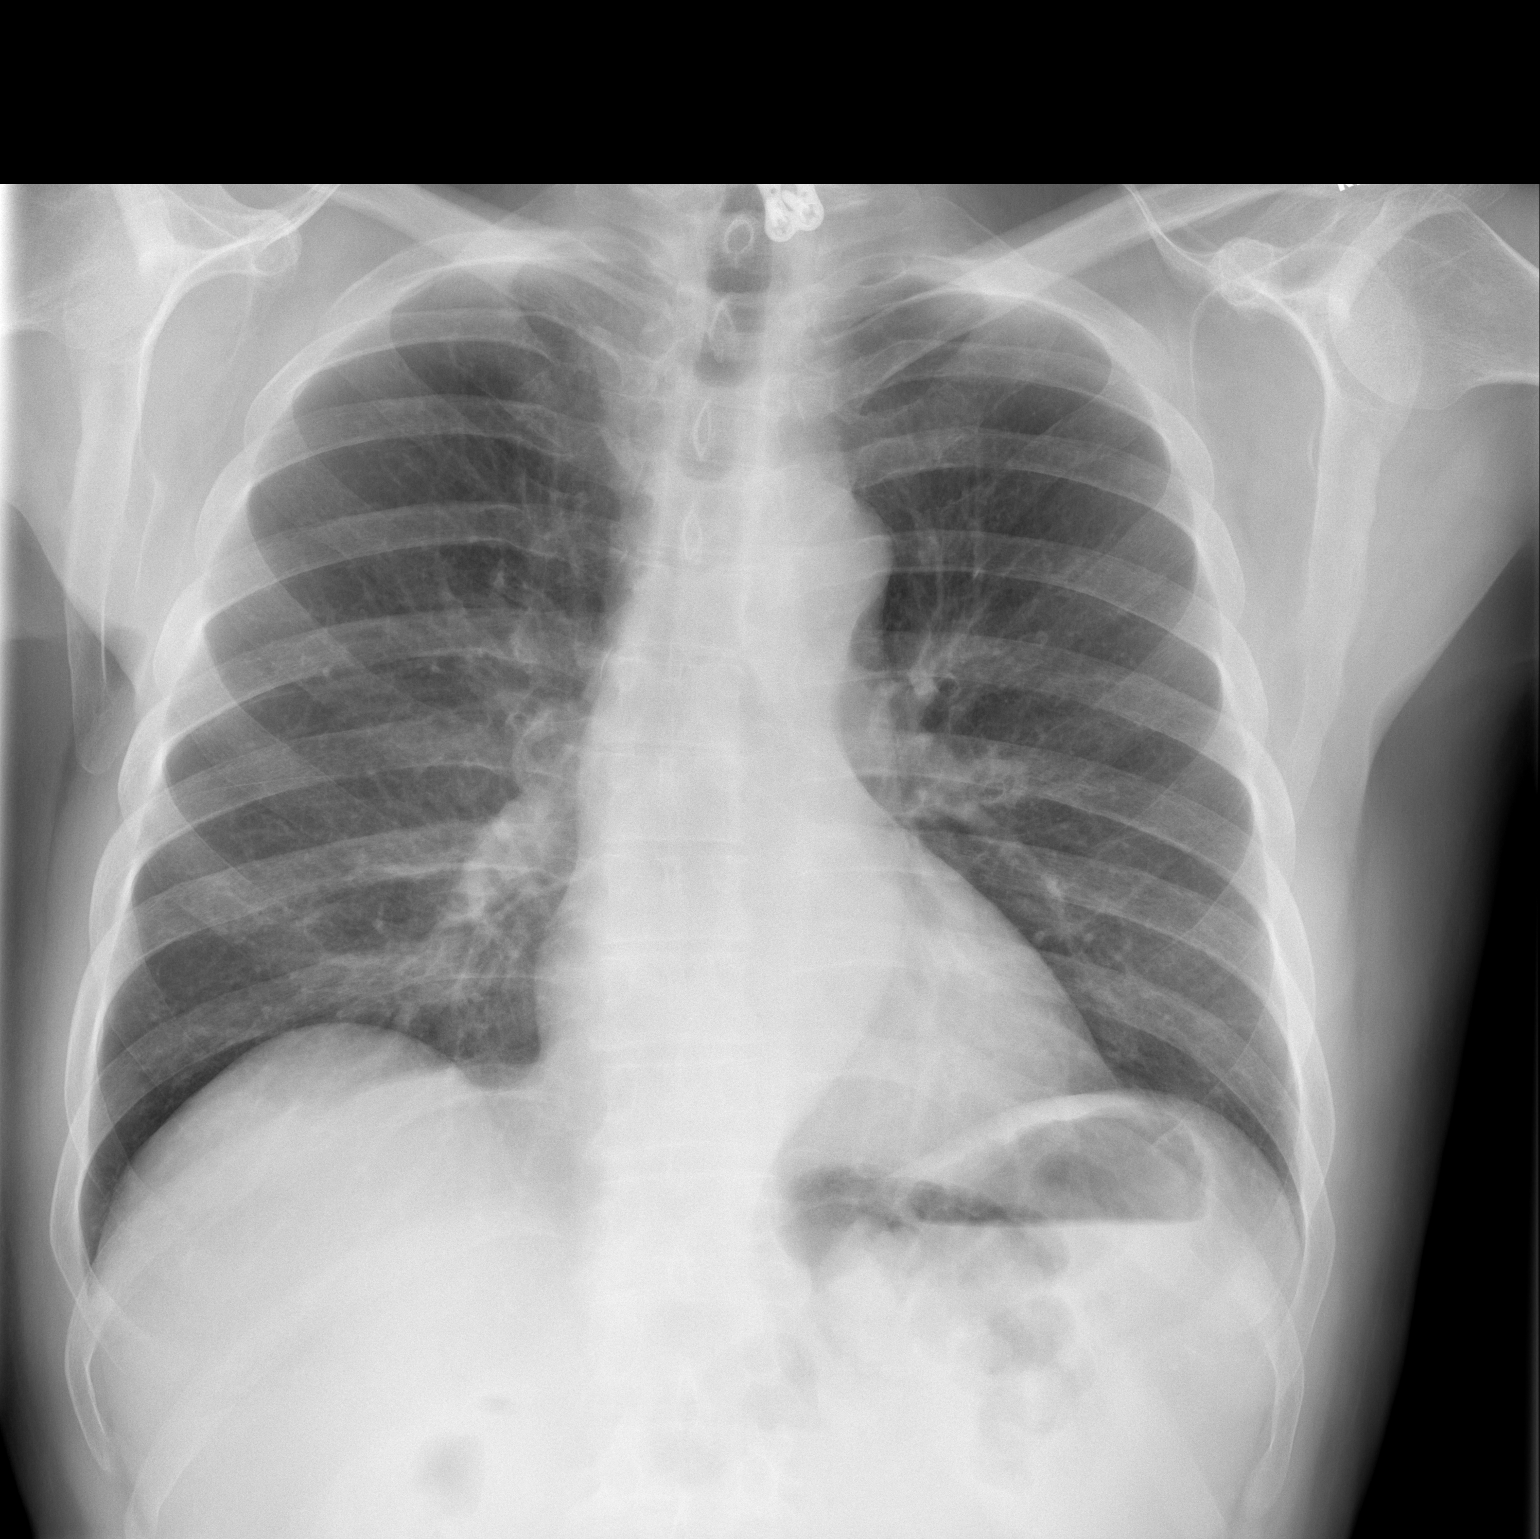

[w chest lat]
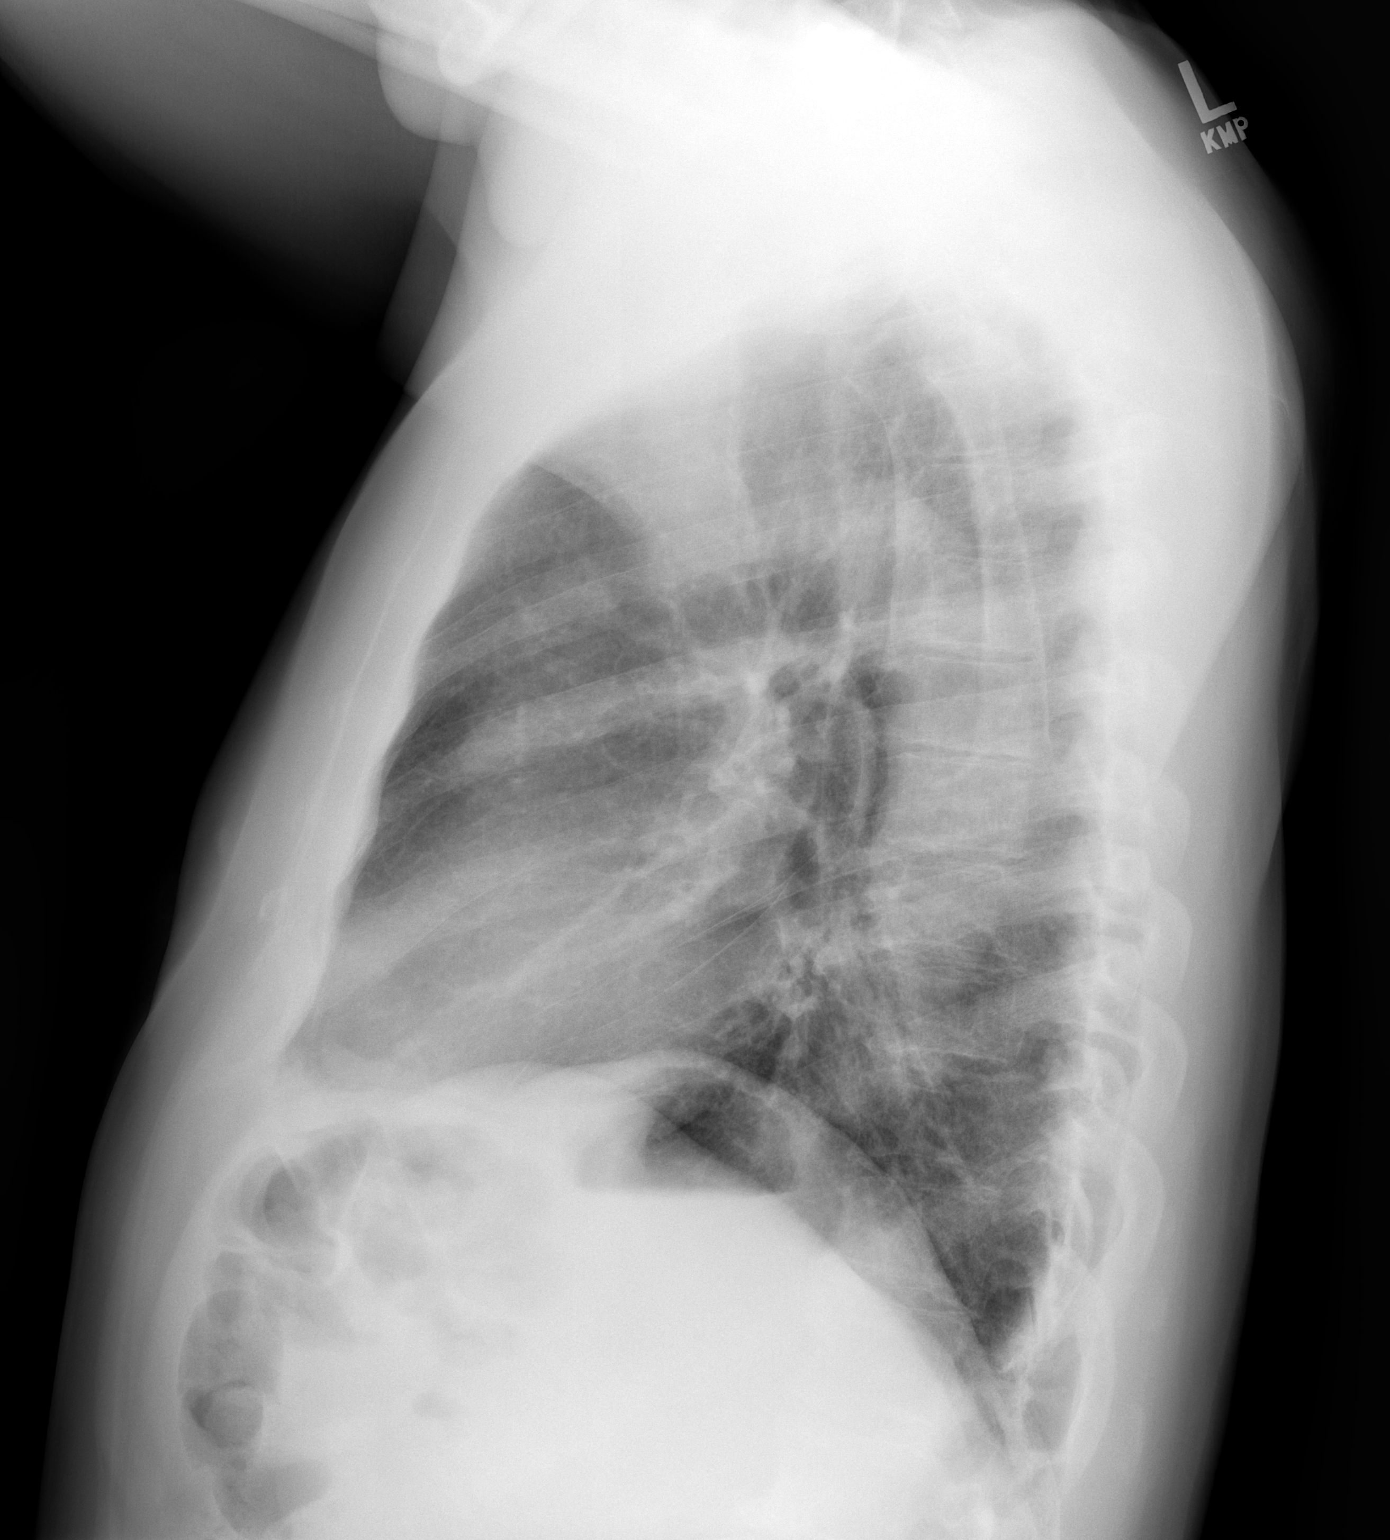

[2 of 2 positions shown; findings below may reference images not displayed]

FINDINGS: Surgical hardware from [facility] overlies the lower cervical spine.
Stable cardiomediastinal silhouette with normal heart size and
mildly tortuous atherosclerotic thoracic aorta. No pneumothorax. No
pleural effusion. Lungs appear clear, with no acute consolidative
airspace disease and no pulmonary edema.
IMPRESSION: No active cardiopulmonary disease.

## 2018-04-21 DIAGNOSIS — K30 Functional dyspepsia: Secondary | ICD-10-CM | POA: Diagnosis not present

## 2018-04-21 DIAGNOSIS — R1031 Right lower quadrant pain: Secondary | ICD-10-CM | POA: Diagnosis not present

## 2018-04-21 DIAGNOSIS — K429 Umbilical hernia without obstruction or gangrene: Secondary | ICD-10-CM | POA: Diagnosis not present

## 2018-05-10 ENCOUNTER — Other Ambulatory Visit: Payer: Self-pay | Admitting: Family Medicine

## 2018-05-10 DIAGNOSIS — R1011 Right upper quadrant pain: Secondary | ICD-10-CM

## 2018-05-10 DIAGNOSIS — R1013 Epigastric pain: Secondary | ICD-10-CM | POA: Diagnosis not present

## 2018-05-11 ENCOUNTER — Ambulatory Visit
Admission: RE | Admit: 2018-05-11 | Discharge: 2018-05-11 | Disposition: A | Discharge status: Home / Self Care | Payer: Medicare Other | Source: Ambulatory Visit | Attending: Family Medicine | Admitting: Family Medicine

## 2018-05-11 DIAGNOSIS — R1011 Right upper quadrant pain: Secondary | ICD-10-CM | POA: Diagnosis not present

## 2018-05-20 DIAGNOSIS — K429 Umbilical hernia without obstruction or gangrene: Secondary | ICD-10-CM | POA: Diagnosis not present

## 2018-06-10 DIAGNOSIS — J069 Acute upper respiratory infection, unspecified: Secondary | ICD-10-CM | POA: Diagnosis not present

## 2018-06-10 DIAGNOSIS — I700 Atherosclerosis of aorta: Secondary | ICD-10-CM | POA: Diagnosis not present

## 2018-10-17 IMAGING — US US ABDOMEN LIMITED
1 series · 14 of 25 positions shown · non-contrast
Comparison: None.

CLINICAL DATA: Chronic right upper quadrant abdominal pain.

EXAM:
ULTRASOUND ABDOMEN LIMITED RIGHT UPPER QUADRANT

[Series 1: us abdomen limited · 0.14mm/px · 14 of 44 slices shown]
[im 1/44]
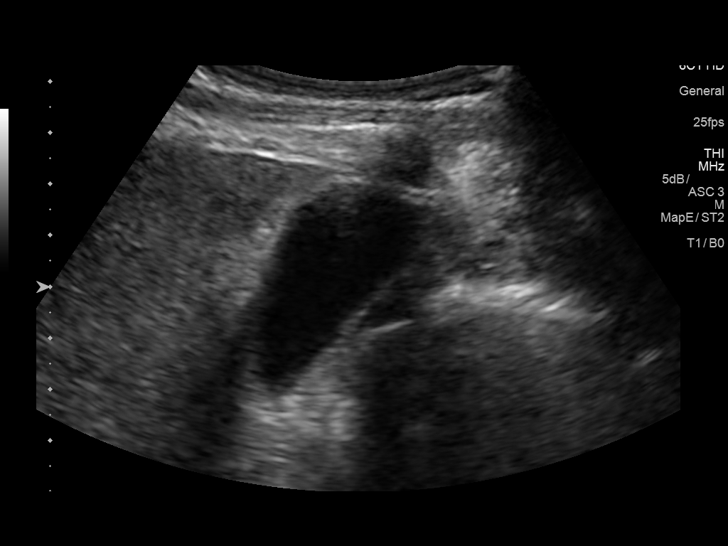
[im 4/44]
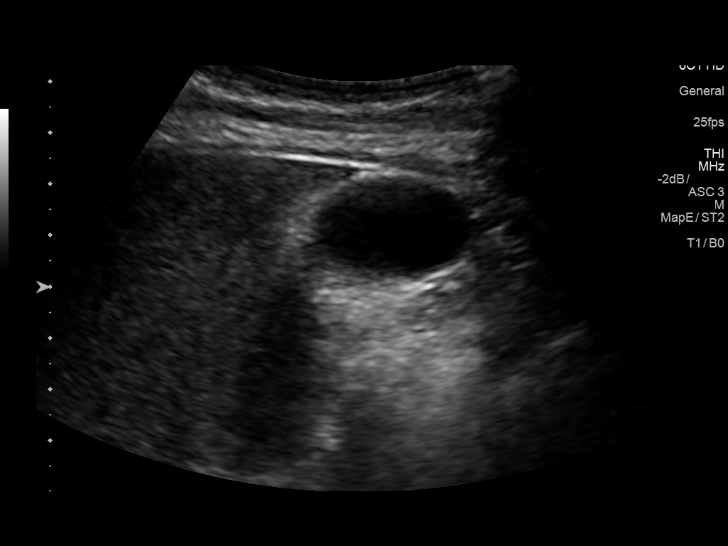
[im 8/44]
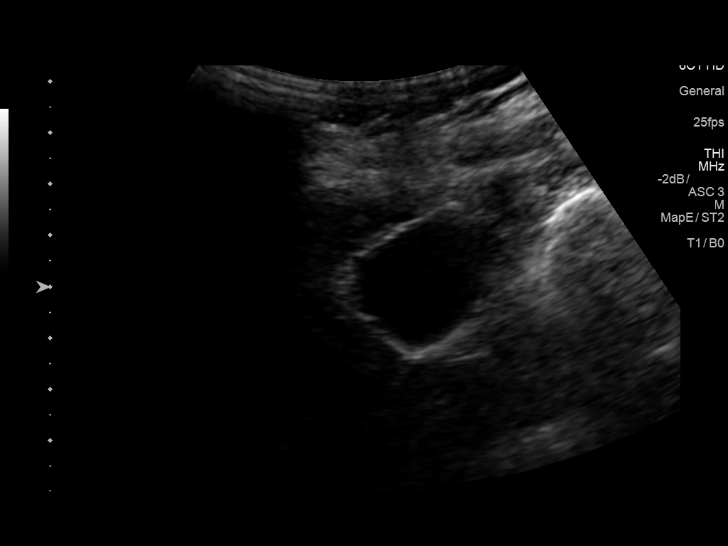
[im 11/44]
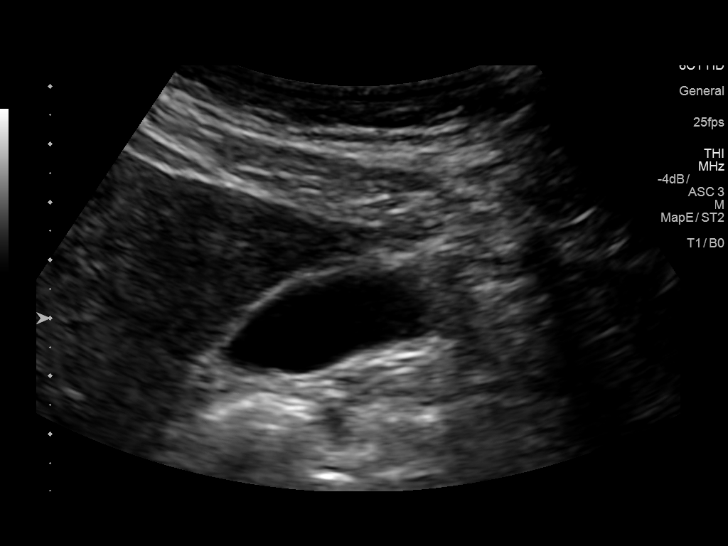
[im 15/44]
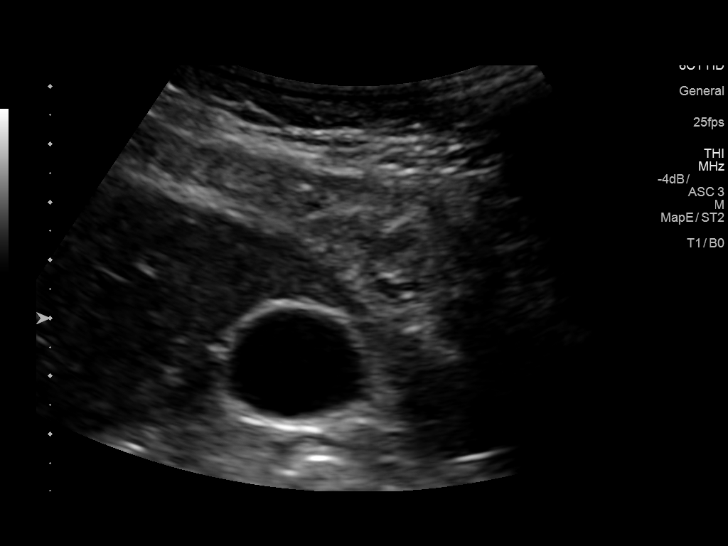
[im 17/44]
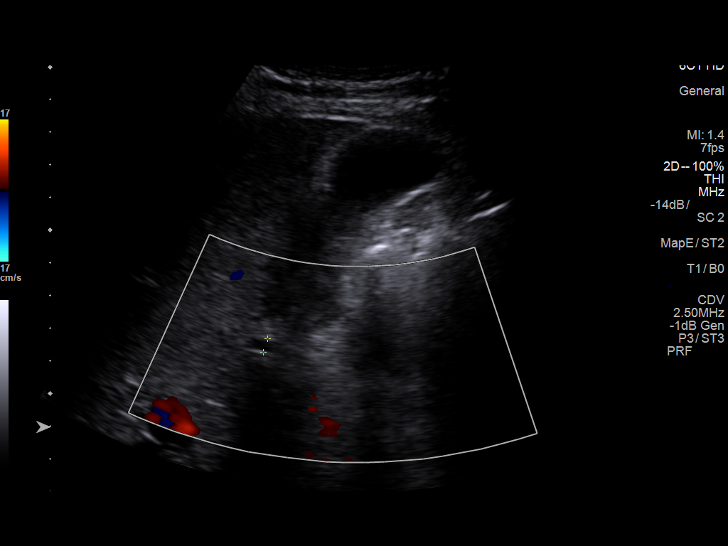
[im 20/44]
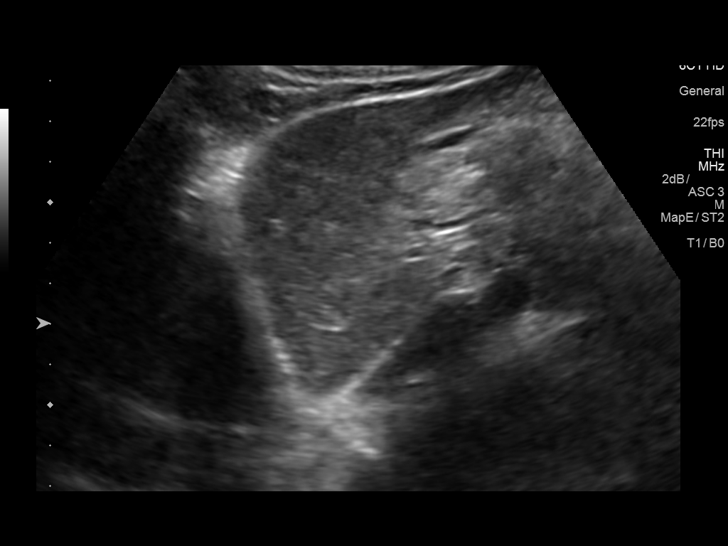
[im 24/44]
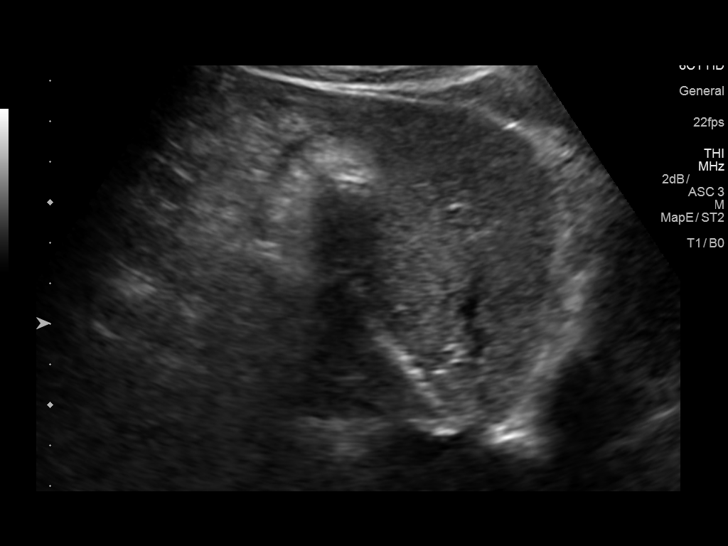
[im 27/44]
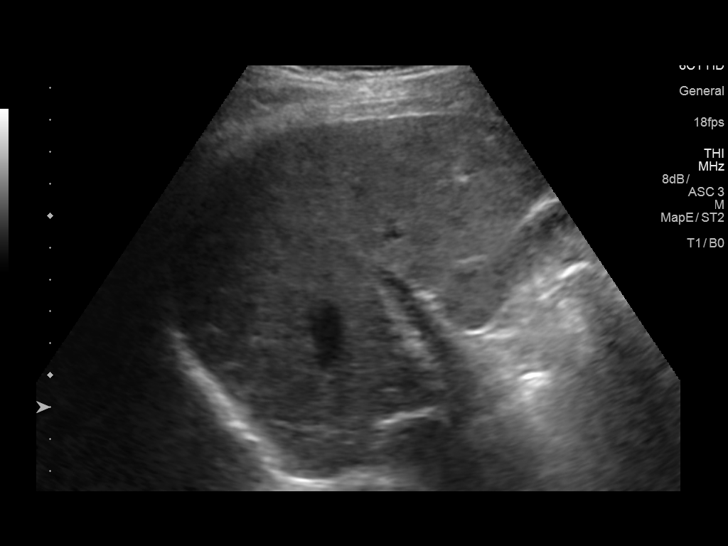
[im 29/44]
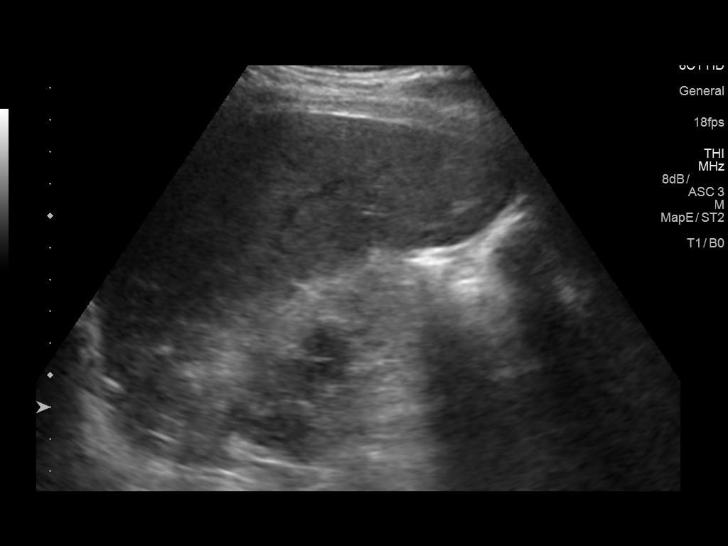
[im 33/44]
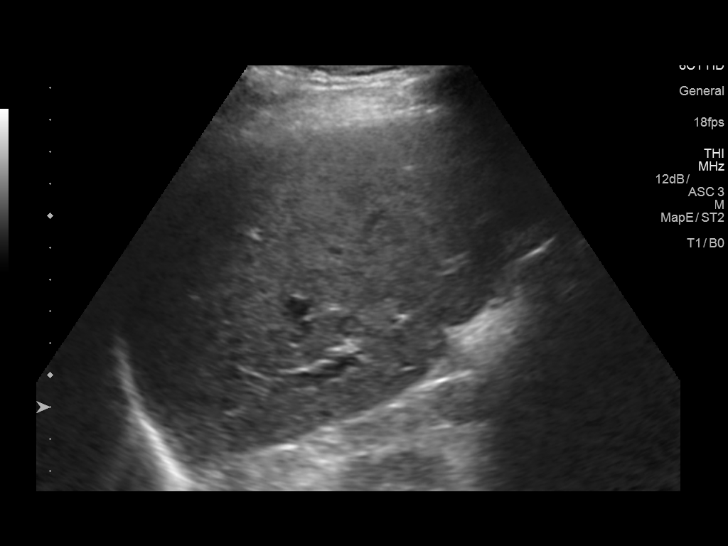
[im 36/44]
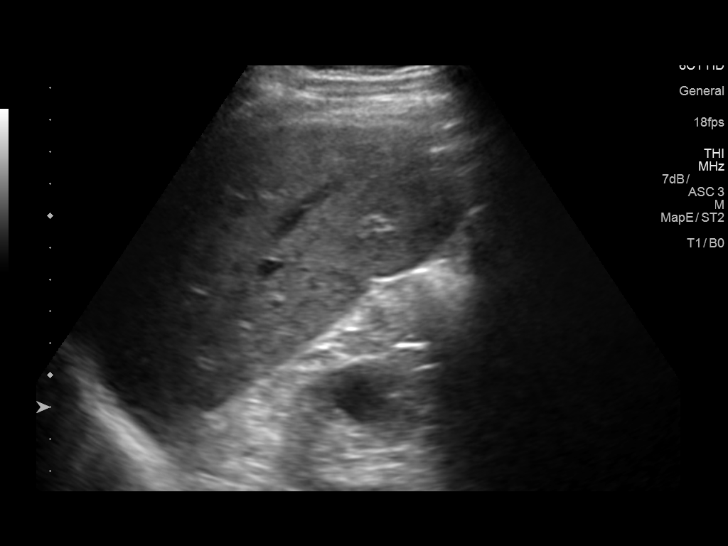
[im 40/44]
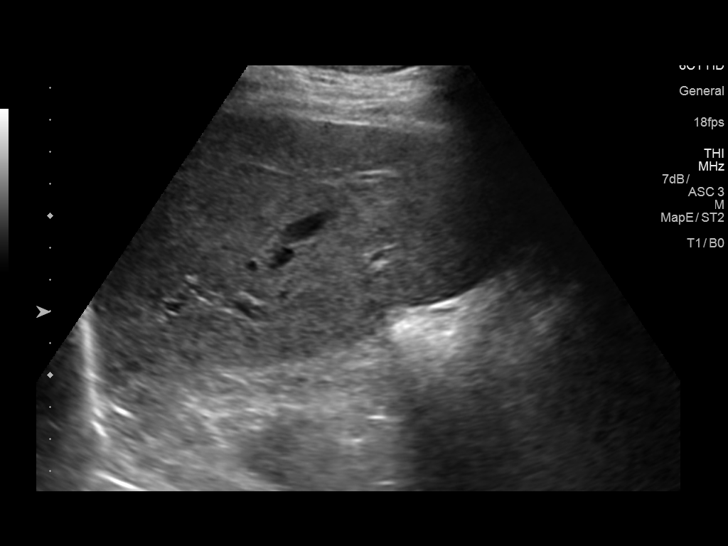
[im 44/44]
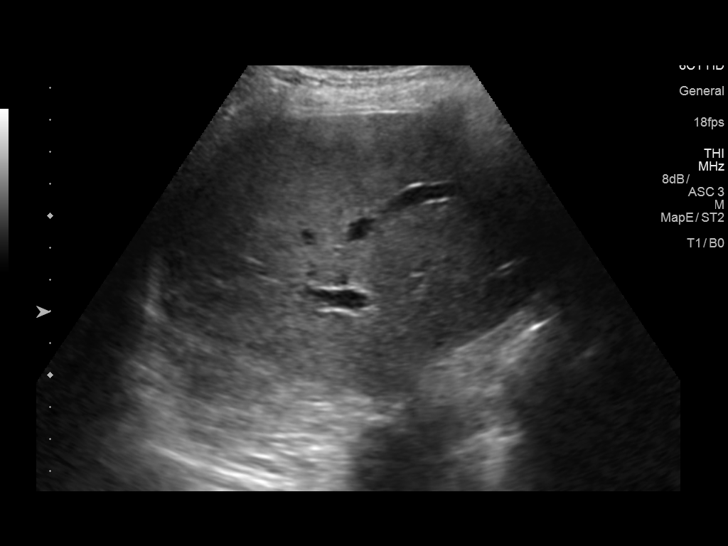

[14 of 25 positions shown; findings below may reference images not displayed]

FINDINGS: Gallbladder:

No gallstones or wall thickening visualized. No sonographic Murphy
sign noted by sonographer.

Common bile duct:

Diameter: 4.3 mm which is within normal limits.

Liver:

No focal lesion identified. Within normal limits in parenchymal
echogenicity. Portal vein is patent on color Doppler imaging with
normal direction of blood flow towards the liver.
IMPRESSION: No abnormality seen in the right upper quadrant of the abdomen.

## 2018-11-07 DIAGNOSIS — I10 Essential (primary) hypertension: Secondary | ICD-10-CM | POA: Diagnosis not present

## 2018-11-07 DIAGNOSIS — E7800 Pure hypercholesterolemia, unspecified: Secondary | ICD-10-CM | POA: Diagnosis not present

## 2018-11-07 DIAGNOSIS — Z0000 Encounter for general adult medical examination without abnormal findings: Secondary | ICD-10-CM | POA: Diagnosis not present

## 2018-11-15 DIAGNOSIS — M549 Dorsalgia, unspecified: Secondary | ICD-10-CM | POA: Insufficient documentation

## 2018-11-29 DIAGNOSIS — M47816 Spondylosis without myelopathy or radiculopathy, lumbar region: Secondary | ICD-10-CM | POA: Diagnosis not present

## 2018-11-29 DIAGNOSIS — M48062 Spinal stenosis, lumbar region with neurogenic claudication: Secondary | ICD-10-CM | POA: Diagnosis not present

## 2018-11-29 DIAGNOSIS — M5442 Lumbago with sciatica, left side: Secondary | ICD-10-CM | POA: Diagnosis not present

## 2018-11-29 DIAGNOSIS — M546 Pain in thoracic spine: Secondary | ICD-10-CM | POA: Diagnosis not present

## 2018-11-29 DIAGNOSIS — G8929 Other chronic pain: Secondary | ICD-10-CM | POA: Diagnosis not present

## 2018-11-29 DIAGNOSIS — M5136 Other intervertebral disc degeneration, lumbar region: Secondary | ICD-10-CM | POA: Diagnosis not present

## 2018-11-29 DIAGNOSIS — M5441 Lumbago with sciatica, right side: Secondary | ICD-10-CM | POA: Diagnosis not present

## 2018-11-29 DIAGNOSIS — M48061 Spinal stenosis, lumbar region without neurogenic claudication: Secondary | ICD-10-CM | POA: Insufficient documentation

## 2018-11-29 DIAGNOSIS — M549 Dorsalgia, unspecified: Secondary | ICD-10-CM | POA: Diagnosis not present

## 2018-12-15 DIAGNOSIS — M5126 Other intervertebral disc displacement, lumbar region: Secondary | ICD-10-CM | POA: Diagnosis not present

## 2018-12-15 DIAGNOSIS — M48062 Spinal stenosis, lumbar region with neurogenic claudication: Secondary | ICD-10-CM | POA: Diagnosis not present

## 2018-12-15 DIAGNOSIS — M47816 Spondylosis without myelopathy or radiculopathy, lumbar region: Secondary | ICD-10-CM | POA: Diagnosis not present

## 2018-12-15 DIAGNOSIS — M48061 Spinal stenosis, lumbar region without neurogenic claudication: Secondary | ICD-10-CM | POA: Diagnosis not present

## 2018-12-23 DIAGNOSIS — J029 Acute pharyngitis, unspecified: Secondary | ICD-10-CM | POA: Diagnosis not present

## 2018-12-26 DIAGNOSIS — M48062 Spinal stenosis, lumbar region with neurogenic claudication: Secondary | ICD-10-CM | POA: Diagnosis not present

## 2018-12-26 DIAGNOSIS — M47816 Spondylosis without myelopathy or radiculopathy, lumbar region: Secondary | ICD-10-CM | POA: Diagnosis not present

## 2018-12-26 DIAGNOSIS — Z6826 Body mass index (BMI) 26.0-26.9, adult: Secondary | ICD-10-CM | POA: Diagnosis not present

## 2018-12-26 DIAGNOSIS — I10 Essential (primary) hypertension: Secondary | ICD-10-CM | POA: Diagnosis not present

## 2018-12-26 DIAGNOSIS — M4316 Spondylolisthesis, lumbar region: Secondary | ICD-10-CM | POA: Insufficient documentation

## 2018-12-26 DIAGNOSIS — M5136 Other intervertebral disc degeneration, lumbar region: Secondary | ICD-10-CM | POA: Diagnosis not present

## 2019-01-12 DIAGNOSIS — M4726 Other spondylosis with radiculopathy, lumbar region: Secondary | ICD-10-CM | POA: Diagnosis not present

## 2019-01-12 DIAGNOSIS — M5136 Other intervertebral disc degeneration, lumbar region: Secondary | ICD-10-CM | POA: Diagnosis not present

## 2019-01-12 DIAGNOSIS — M48061 Spinal stenosis, lumbar region without neurogenic claudication: Secondary | ICD-10-CM | POA: Diagnosis not present

## 2019-02-24 DIAGNOSIS — D170 Benign lipomatous neoplasm of skin and subcutaneous tissue of head, face and neck: Secondary | ICD-10-CM | POA: Diagnosis not present

## 2019-02-24 DIAGNOSIS — D485 Neoplasm of uncertain behavior of skin: Secondary | ICD-10-CM | POA: Diagnosis not present

## 2019-02-24 DIAGNOSIS — L309 Dermatitis, unspecified: Secondary | ICD-10-CM | POA: Diagnosis not present

## 2019-02-24 DIAGNOSIS — C4339 Malignant melanoma of other parts of face: Secondary | ICD-10-CM | POA: Diagnosis not present

## 2019-03-09 DIAGNOSIS — C4339 Malignant melanoma of other parts of face: Secondary | ICD-10-CM | POA: Diagnosis not present

## 2019-03-10 DIAGNOSIS — R911 Solitary pulmonary nodule: Secondary | ICD-10-CM | POA: Diagnosis not present

## 2019-03-10 DIAGNOSIS — N289 Disorder of kidney and ureter, unspecified: Secondary | ICD-10-CM | POA: Diagnosis not present

## 2019-03-10 DIAGNOSIS — C434 Malignant melanoma of scalp and neck: Secondary | ICD-10-CM | POA: Insufficient documentation

## 2019-03-10 DIAGNOSIS — C4339 Malignant melanoma of other parts of face: Secondary | ICD-10-CM | POA: Diagnosis not present

## 2019-03-10 DIAGNOSIS — E278 Other specified disorders of adrenal gland: Secondary | ICD-10-CM | POA: Diagnosis not present

## 2019-03-14 DIAGNOSIS — Z1159 Encounter for screening for other viral diseases: Secondary | ICD-10-CM | POA: Diagnosis not present

## 2019-03-14 DIAGNOSIS — Z01812 Encounter for preprocedural laboratory examination: Secondary | ICD-10-CM | POA: Diagnosis not present

## 2019-03-14 DIAGNOSIS — Z20828 Contact with and (suspected) exposure to other viral communicable diseases: Secondary | ICD-10-CM | POA: Diagnosis not present

## 2019-03-14 DIAGNOSIS — C434 Malignant melanoma of scalp and neck: Secondary | ICD-10-CM | POA: Diagnosis not present

## 2019-03-16 DIAGNOSIS — R911 Solitary pulmonary nodule: Secondary | ICD-10-CM | POA: Diagnosis not present

## 2019-03-16 DIAGNOSIS — R9431 Abnormal electrocardiogram [ECG] [EKG]: Secondary | ICD-10-CM | POA: Diagnosis not present

## 2019-03-16 DIAGNOSIS — Z87891 Personal history of nicotine dependence: Secondary | ICD-10-CM | POA: Diagnosis not present

## 2019-03-16 DIAGNOSIS — C4339 Malignant melanoma of other parts of face: Secondary | ICD-10-CM | POA: Diagnosis not present

## 2019-03-16 DIAGNOSIS — I10 Essential (primary) hypertension: Secondary | ICD-10-CM | POA: Diagnosis not present

## 2019-03-20 DIAGNOSIS — R911 Solitary pulmonary nodule: Secondary | ICD-10-CM | POA: Diagnosis not present

## 2019-03-20 DIAGNOSIS — C4330 Malignant melanoma of unspecified part of face: Secondary | ICD-10-CM | POA: Diagnosis not present

## 2019-03-20 DIAGNOSIS — I10 Essential (primary) hypertension: Secondary | ICD-10-CM | POA: Diagnosis not present

## 2019-03-20 DIAGNOSIS — C434 Malignant melanoma of scalp and neck: Secondary | ICD-10-CM | POA: Diagnosis not present

## 2019-03-20 DIAGNOSIS — C4339 Malignant melanoma of other parts of face: Secondary | ICD-10-CM | POA: Diagnosis not present

## 2019-03-20 DIAGNOSIS — Z87891 Personal history of nicotine dependence: Secondary | ICD-10-CM | POA: Diagnosis not present

## 2019-03-28 DIAGNOSIS — M5442 Lumbago with sciatica, left side: Secondary | ICD-10-CM | POA: Diagnosis not present

## 2019-03-28 DIAGNOSIS — M5136 Other intervertebral disc degeneration, lumbar region: Secondary | ICD-10-CM | POA: Diagnosis not present

## 2019-03-28 DIAGNOSIS — M4316 Spondylolisthesis, lumbar region: Secondary | ICD-10-CM | POA: Diagnosis not present

## 2019-03-28 DIAGNOSIS — I10 Essential (primary) hypertension: Secondary | ICD-10-CM | POA: Diagnosis not present

## 2019-03-28 DIAGNOSIS — Z6826 Body mass index (BMI) 26.0-26.9, adult: Secondary | ICD-10-CM | POA: Diagnosis not present

## 2019-03-28 DIAGNOSIS — M47816 Spondylosis without myelopathy or radiculopathy, lumbar region: Secondary | ICD-10-CM | POA: Diagnosis not present

## 2019-03-28 DIAGNOSIS — M48062 Spinal stenosis, lumbar region with neurogenic claudication: Secondary | ICD-10-CM | POA: Diagnosis not present

## 2019-04-06 DIAGNOSIS — Z20828 Contact with and (suspected) exposure to other viral communicable diseases: Secondary | ICD-10-CM | POA: Diagnosis not present

## 2019-04-06 DIAGNOSIS — Z01812 Encounter for preprocedural laboratory examination: Secondary | ICD-10-CM | POA: Diagnosis not present

## 2019-04-06 DIAGNOSIS — C434 Malignant melanoma of scalp and neck: Secondary | ICD-10-CM | POA: Diagnosis not present

## 2019-04-12 DIAGNOSIS — Z483 Aftercare following surgery for neoplasm: Secondary | ICD-10-CM | POA: Diagnosis not present

## 2019-04-12 DIAGNOSIS — Z87891 Personal history of nicotine dependence: Secondary | ICD-10-CM | POA: Diagnosis not present

## 2019-04-12 DIAGNOSIS — I10 Essential (primary) hypertension: Secondary | ICD-10-CM | POA: Diagnosis not present

## 2019-04-12 DIAGNOSIS — C4330 Malignant melanoma of unspecified part of face: Secondary | ICD-10-CM | POA: Diagnosis not present

## 2019-04-12 DIAGNOSIS — C434 Malignant melanoma of scalp and neck: Secondary | ICD-10-CM | POA: Diagnosis not present

## 2019-04-12 DIAGNOSIS — Z481 Encounter for planned postprocedural wound closure: Secondary | ICD-10-CM | POA: Diagnosis not present

## 2019-04-28 DIAGNOSIS — C4330 Malignant melanoma of unspecified part of face: Secondary | ICD-10-CM | POA: Diagnosis not present

## 2019-04-28 DIAGNOSIS — R911 Solitary pulmonary nodule: Secondary | ICD-10-CM | POA: Diagnosis not present

## 2019-04-28 DIAGNOSIS — R918 Other nonspecific abnormal finding of lung field: Secondary | ICD-10-CM | POA: Diagnosis not present

## 2019-05-02 DIAGNOSIS — C4330 Malignant melanoma of unspecified part of face: Secondary | ICD-10-CM | POA: Diagnosis not present

## 2019-05-02 DIAGNOSIS — C4339 Malignant melanoma of other parts of face: Secondary | ICD-10-CM | POA: Diagnosis not present

## 2019-06-01 DIAGNOSIS — L853 Xerosis cutis: Secondary | ICD-10-CM | POA: Diagnosis not present

## 2019-06-01 DIAGNOSIS — Q825 Congenital non-neoplastic nevus: Secondary | ICD-10-CM | POA: Diagnosis not present

## 2019-06-01 DIAGNOSIS — Z85820 Personal history of malignant melanoma of skin: Secondary | ICD-10-CM | POA: Diagnosis not present

## 2019-06-01 DIAGNOSIS — B070 Plantar wart: Secondary | ICD-10-CM | POA: Diagnosis not present

## 2019-06-01 DIAGNOSIS — L821 Other seborrheic keratosis: Secondary | ICD-10-CM | POA: Diagnosis not present

## 2019-06-01 DIAGNOSIS — M6740 Ganglion, unspecified site: Secondary | ICD-10-CM | POA: Diagnosis not present

## 2019-06-01 DIAGNOSIS — Z23 Encounter for immunization: Secondary | ICD-10-CM | POA: Diagnosis not present

## 2019-08-28 DIAGNOSIS — I10 Essential (primary) hypertension: Secondary | ICD-10-CM | POA: Diagnosis not present

## 2019-11-08 DIAGNOSIS — E7800 Pure hypercholesterolemia, unspecified: Secondary | ICD-10-CM | POA: Diagnosis not present

## 2019-11-08 DIAGNOSIS — Z1389 Encounter for screening for other disorder: Secondary | ICD-10-CM | POA: Diagnosis not present

## 2019-11-08 DIAGNOSIS — I10 Essential (primary) hypertension: Secondary | ICD-10-CM | POA: Diagnosis not present

## 2019-11-08 DIAGNOSIS — Z0000 Encounter for general adult medical examination without abnormal findings: Secondary | ICD-10-CM | POA: Diagnosis not present

## 2019-11-08 DIAGNOSIS — Z1211 Encounter for screening for malignant neoplasm of colon: Secondary | ICD-10-CM | POA: Diagnosis not present

## 2020-02-22 DIAGNOSIS — M9902 Segmental and somatic dysfunction of thoracic region: Secondary | ICD-10-CM | POA: Diagnosis not present

## 2020-02-22 DIAGNOSIS — M9905 Segmental and somatic dysfunction of pelvic region: Secondary | ICD-10-CM | POA: Diagnosis not present

## 2020-02-22 DIAGNOSIS — M9903 Segmental and somatic dysfunction of lumbar region: Secondary | ICD-10-CM | POA: Diagnosis not present

## 2020-02-22 DIAGNOSIS — R293 Abnormal posture: Secondary | ICD-10-CM | POA: Diagnosis not present

## 2020-02-22 DIAGNOSIS — M9901 Segmental and somatic dysfunction of cervical region: Secondary | ICD-10-CM | POA: Diagnosis not present

## 2020-02-26 DIAGNOSIS — M9902 Segmental and somatic dysfunction of thoracic region: Secondary | ICD-10-CM | POA: Diagnosis not present

## 2020-02-26 DIAGNOSIS — M9903 Segmental and somatic dysfunction of lumbar region: Secondary | ICD-10-CM | POA: Diagnosis not present

## 2020-02-26 DIAGNOSIS — M9905 Segmental and somatic dysfunction of pelvic region: Secondary | ICD-10-CM | POA: Diagnosis not present

## 2020-02-26 DIAGNOSIS — M9901 Segmental and somatic dysfunction of cervical region: Secondary | ICD-10-CM | POA: Diagnosis not present

## 2020-02-26 DIAGNOSIS — R293 Abnormal posture: Secondary | ICD-10-CM | POA: Diagnosis not present

## 2020-02-29 DIAGNOSIS — L719 Rosacea, unspecified: Secondary | ICD-10-CM | POA: Diagnosis not present

## 2020-02-29 DIAGNOSIS — M6740 Ganglion, unspecified site: Secondary | ICD-10-CM | POA: Diagnosis not present

## 2020-02-29 DIAGNOSIS — M9902 Segmental and somatic dysfunction of thoracic region: Secondary | ICD-10-CM | POA: Diagnosis not present

## 2020-02-29 DIAGNOSIS — M9905 Segmental and somatic dysfunction of pelvic region: Secondary | ICD-10-CM | POA: Diagnosis not present

## 2020-02-29 DIAGNOSIS — Z85820 Personal history of malignant melanoma of skin: Secondary | ICD-10-CM | POA: Diagnosis not present

## 2020-02-29 DIAGNOSIS — M9901 Segmental and somatic dysfunction of cervical region: Secondary | ICD-10-CM | POA: Diagnosis not present

## 2020-02-29 DIAGNOSIS — Q825 Congenital non-neoplastic nevus: Secondary | ICD-10-CM | POA: Diagnosis not present

## 2020-02-29 DIAGNOSIS — Z23 Encounter for immunization: Secondary | ICD-10-CM | POA: Diagnosis not present

## 2020-02-29 DIAGNOSIS — M9903 Segmental and somatic dysfunction of lumbar region: Secondary | ICD-10-CM | POA: Diagnosis not present

## 2020-02-29 DIAGNOSIS — R293 Abnormal posture: Secondary | ICD-10-CM | POA: Diagnosis not present

## 2020-02-29 DIAGNOSIS — L853 Xerosis cutis: Secondary | ICD-10-CM | POA: Diagnosis not present

## 2020-02-29 DIAGNOSIS — L821 Other seborrheic keratosis: Secondary | ICD-10-CM | POA: Diagnosis not present

## 2020-03-13 DIAGNOSIS — M9903 Segmental and somatic dysfunction of lumbar region: Secondary | ICD-10-CM | POA: Diagnosis not present

## 2020-03-13 DIAGNOSIS — M9901 Segmental and somatic dysfunction of cervical region: Secondary | ICD-10-CM | POA: Diagnosis not present

## 2020-03-13 DIAGNOSIS — M9905 Segmental and somatic dysfunction of pelvic region: Secondary | ICD-10-CM | POA: Diagnosis not present

## 2020-03-13 DIAGNOSIS — M9902 Segmental and somatic dysfunction of thoracic region: Secondary | ICD-10-CM | POA: Diagnosis not present

## 2020-03-13 DIAGNOSIS — R293 Abnormal posture: Secondary | ICD-10-CM | POA: Diagnosis not present

## 2020-03-14 DIAGNOSIS — M9905 Segmental and somatic dysfunction of pelvic region: Secondary | ICD-10-CM | POA: Diagnosis not present

## 2020-03-14 DIAGNOSIS — M9901 Segmental and somatic dysfunction of cervical region: Secondary | ICD-10-CM | POA: Diagnosis not present

## 2020-03-14 DIAGNOSIS — M9902 Segmental and somatic dysfunction of thoracic region: Secondary | ICD-10-CM | POA: Diagnosis not present

## 2020-03-14 DIAGNOSIS — R293 Abnormal posture: Secondary | ICD-10-CM | POA: Diagnosis not present

## 2020-03-14 DIAGNOSIS — M9903 Segmental and somatic dysfunction of lumbar region: Secondary | ICD-10-CM | POA: Diagnosis not present

## 2020-03-18 DIAGNOSIS — R293 Abnormal posture: Secondary | ICD-10-CM | POA: Diagnosis not present

## 2020-03-18 DIAGNOSIS — M9903 Segmental and somatic dysfunction of lumbar region: Secondary | ICD-10-CM | POA: Diagnosis not present

## 2020-03-18 DIAGNOSIS — M9902 Segmental and somatic dysfunction of thoracic region: Secondary | ICD-10-CM | POA: Diagnosis not present

## 2020-03-18 DIAGNOSIS — M9901 Segmental and somatic dysfunction of cervical region: Secondary | ICD-10-CM | POA: Diagnosis not present

## 2020-03-18 DIAGNOSIS — M9905 Segmental and somatic dysfunction of pelvic region: Secondary | ICD-10-CM | POA: Diagnosis not present

## 2020-03-20 DIAGNOSIS — M9902 Segmental and somatic dysfunction of thoracic region: Secondary | ICD-10-CM | POA: Diagnosis not present

## 2020-03-20 DIAGNOSIS — M9903 Segmental and somatic dysfunction of lumbar region: Secondary | ICD-10-CM | POA: Diagnosis not present

## 2020-03-20 DIAGNOSIS — M9901 Segmental and somatic dysfunction of cervical region: Secondary | ICD-10-CM | POA: Diagnosis not present

## 2020-03-20 DIAGNOSIS — M9905 Segmental and somatic dysfunction of pelvic region: Secondary | ICD-10-CM | POA: Diagnosis not present

## 2020-03-20 DIAGNOSIS — R293 Abnormal posture: Secondary | ICD-10-CM | POA: Diagnosis not present

## 2020-03-21 DIAGNOSIS — M9905 Segmental and somatic dysfunction of pelvic region: Secondary | ICD-10-CM | POA: Diagnosis not present

## 2020-03-21 DIAGNOSIS — M9901 Segmental and somatic dysfunction of cervical region: Secondary | ICD-10-CM | POA: Diagnosis not present

## 2020-03-21 DIAGNOSIS — M9903 Segmental and somatic dysfunction of lumbar region: Secondary | ICD-10-CM | POA: Diagnosis not present

## 2020-03-21 DIAGNOSIS — M9902 Segmental and somatic dysfunction of thoracic region: Secondary | ICD-10-CM | POA: Diagnosis not present

## 2020-03-21 DIAGNOSIS — R293 Abnormal posture: Secondary | ICD-10-CM | POA: Diagnosis not present

## 2020-03-25 DIAGNOSIS — M9902 Segmental and somatic dysfunction of thoracic region: Secondary | ICD-10-CM | POA: Diagnosis not present

## 2020-03-25 DIAGNOSIS — M9901 Segmental and somatic dysfunction of cervical region: Secondary | ICD-10-CM | POA: Diagnosis not present

## 2020-03-25 DIAGNOSIS — M9903 Segmental and somatic dysfunction of lumbar region: Secondary | ICD-10-CM | POA: Diagnosis not present

## 2020-03-25 DIAGNOSIS — R293 Abnormal posture: Secondary | ICD-10-CM | POA: Diagnosis not present

## 2020-03-25 DIAGNOSIS — M9905 Segmental and somatic dysfunction of pelvic region: Secondary | ICD-10-CM | POA: Diagnosis not present

## 2020-03-26 DIAGNOSIS — C434 Malignant melanoma of scalp and neck: Secondary | ICD-10-CM | POA: Diagnosis not present

## 2020-03-26 DIAGNOSIS — R221 Localized swelling, mass and lump, neck: Secondary | ICD-10-CM | POA: Insufficient documentation

## 2020-03-27 DIAGNOSIS — M9901 Segmental and somatic dysfunction of cervical region: Secondary | ICD-10-CM | POA: Diagnosis not present

## 2020-03-27 DIAGNOSIS — M9905 Segmental and somatic dysfunction of pelvic region: Secondary | ICD-10-CM | POA: Diagnosis not present

## 2020-03-27 DIAGNOSIS — M9902 Segmental and somatic dysfunction of thoracic region: Secondary | ICD-10-CM | POA: Diagnosis not present

## 2020-03-27 DIAGNOSIS — R293 Abnormal posture: Secondary | ICD-10-CM | POA: Diagnosis not present

## 2020-03-27 DIAGNOSIS — M9903 Segmental and somatic dysfunction of lumbar region: Secondary | ICD-10-CM | POA: Diagnosis not present

## 2020-03-28 DIAGNOSIS — M9901 Segmental and somatic dysfunction of cervical region: Secondary | ICD-10-CM | POA: Diagnosis not present

## 2020-03-28 DIAGNOSIS — R293 Abnormal posture: Secondary | ICD-10-CM | POA: Diagnosis not present

## 2020-03-28 DIAGNOSIS — M9905 Segmental and somatic dysfunction of pelvic region: Secondary | ICD-10-CM | POA: Diagnosis not present

## 2020-03-28 DIAGNOSIS — M9903 Segmental and somatic dysfunction of lumbar region: Secondary | ICD-10-CM | POA: Diagnosis not present

## 2020-03-28 DIAGNOSIS — M9902 Segmental and somatic dysfunction of thoracic region: Secondary | ICD-10-CM | POA: Diagnosis not present

## 2020-04-04 DIAGNOSIS — M9902 Segmental and somatic dysfunction of thoracic region: Secondary | ICD-10-CM | POA: Diagnosis not present

## 2020-04-04 DIAGNOSIS — M9903 Segmental and somatic dysfunction of lumbar region: Secondary | ICD-10-CM | POA: Diagnosis not present

## 2020-04-04 DIAGNOSIS — M9905 Segmental and somatic dysfunction of pelvic region: Secondary | ICD-10-CM | POA: Diagnosis not present

## 2020-04-04 DIAGNOSIS — R293 Abnormal posture: Secondary | ICD-10-CM | POA: Diagnosis not present

## 2020-04-04 DIAGNOSIS — M9901 Segmental and somatic dysfunction of cervical region: Secondary | ICD-10-CM | POA: Diagnosis not present

## 2020-04-08 DIAGNOSIS — R293 Abnormal posture: Secondary | ICD-10-CM | POA: Diagnosis not present

## 2020-04-08 DIAGNOSIS — M9905 Segmental and somatic dysfunction of pelvic region: Secondary | ICD-10-CM | POA: Diagnosis not present

## 2020-04-08 DIAGNOSIS — M9903 Segmental and somatic dysfunction of lumbar region: Secondary | ICD-10-CM | POA: Diagnosis not present

## 2020-04-08 DIAGNOSIS — M9902 Segmental and somatic dysfunction of thoracic region: Secondary | ICD-10-CM | POA: Diagnosis not present

## 2020-04-08 DIAGNOSIS — M9901 Segmental and somatic dysfunction of cervical region: Secondary | ICD-10-CM | POA: Diagnosis not present

## 2020-04-10 DIAGNOSIS — M9903 Segmental and somatic dysfunction of lumbar region: Secondary | ICD-10-CM | POA: Diagnosis not present

## 2020-04-10 DIAGNOSIS — R293 Abnormal posture: Secondary | ICD-10-CM | POA: Diagnosis not present

## 2020-04-10 DIAGNOSIS — M9902 Segmental and somatic dysfunction of thoracic region: Secondary | ICD-10-CM | POA: Diagnosis not present

## 2020-04-10 DIAGNOSIS — M9905 Segmental and somatic dysfunction of pelvic region: Secondary | ICD-10-CM | POA: Diagnosis not present

## 2020-04-10 DIAGNOSIS — M9901 Segmental and somatic dysfunction of cervical region: Secondary | ICD-10-CM | POA: Diagnosis not present

## 2020-04-11 DIAGNOSIS — M9903 Segmental and somatic dysfunction of lumbar region: Secondary | ICD-10-CM | POA: Diagnosis not present

## 2020-04-11 DIAGNOSIS — M9902 Segmental and somatic dysfunction of thoracic region: Secondary | ICD-10-CM | POA: Diagnosis not present

## 2020-04-11 DIAGNOSIS — R293 Abnormal posture: Secondary | ICD-10-CM | POA: Diagnosis not present

## 2020-04-11 DIAGNOSIS — M9901 Segmental and somatic dysfunction of cervical region: Secondary | ICD-10-CM | POA: Diagnosis not present

## 2020-04-11 DIAGNOSIS — M9905 Segmental and somatic dysfunction of pelvic region: Secondary | ICD-10-CM | POA: Diagnosis not present

## 2020-04-15 DIAGNOSIS — M9905 Segmental and somatic dysfunction of pelvic region: Secondary | ICD-10-CM | POA: Diagnosis not present

## 2020-04-15 DIAGNOSIS — M9901 Segmental and somatic dysfunction of cervical region: Secondary | ICD-10-CM | POA: Diagnosis not present

## 2020-04-15 DIAGNOSIS — M9903 Segmental and somatic dysfunction of lumbar region: Secondary | ICD-10-CM | POA: Diagnosis not present

## 2020-04-15 DIAGNOSIS — R293 Abnormal posture: Secondary | ICD-10-CM | POA: Diagnosis not present

## 2020-04-15 DIAGNOSIS — M9902 Segmental and somatic dysfunction of thoracic region: Secondary | ICD-10-CM | POA: Diagnosis not present

## 2020-04-17 DIAGNOSIS — R293 Abnormal posture: Secondary | ICD-10-CM | POA: Diagnosis not present

## 2020-04-17 DIAGNOSIS — M9905 Segmental and somatic dysfunction of pelvic region: Secondary | ICD-10-CM | POA: Diagnosis not present

## 2020-04-17 DIAGNOSIS — M9901 Segmental and somatic dysfunction of cervical region: Secondary | ICD-10-CM | POA: Diagnosis not present

## 2020-04-17 DIAGNOSIS — M9903 Segmental and somatic dysfunction of lumbar region: Secondary | ICD-10-CM | POA: Diagnosis not present

## 2020-04-17 DIAGNOSIS — M9902 Segmental and somatic dysfunction of thoracic region: Secondary | ICD-10-CM | POA: Diagnosis not present

## 2020-04-18 DIAGNOSIS — M9903 Segmental and somatic dysfunction of lumbar region: Secondary | ICD-10-CM | POA: Diagnosis not present

## 2020-04-18 DIAGNOSIS — R293 Abnormal posture: Secondary | ICD-10-CM | POA: Diagnosis not present

## 2020-04-18 DIAGNOSIS — M9905 Segmental and somatic dysfunction of pelvic region: Secondary | ICD-10-CM | POA: Diagnosis not present

## 2020-04-18 DIAGNOSIS — M9901 Segmental and somatic dysfunction of cervical region: Secondary | ICD-10-CM | POA: Diagnosis not present

## 2020-04-18 DIAGNOSIS — M9902 Segmental and somatic dysfunction of thoracic region: Secondary | ICD-10-CM | POA: Diagnosis not present

## 2020-04-19 DIAGNOSIS — R911 Solitary pulmonary nodule: Secondary | ICD-10-CM | POA: Diagnosis not present

## 2020-04-19 DIAGNOSIS — R918 Other nonspecific abnormal finding of lung field: Secondary | ICD-10-CM | POA: Diagnosis not present

## 2020-04-19 DIAGNOSIS — K7689 Other specified diseases of liver: Secondary | ICD-10-CM | POA: Diagnosis not present

## 2020-04-19 DIAGNOSIS — N281 Cyst of kidney, acquired: Secondary | ICD-10-CM | POA: Diagnosis not present

## 2020-04-19 DIAGNOSIS — C4339 Malignant melanoma of other parts of face: Secondary | ICD-10-CM | POA: Diagnosis not present

## 2020-04-19 DIAGNOSIS — Z9889 Other specified postprocedural states: Secondary | ICD-10-CM | POA: Diagnosis not present

## 2020-04-19 DIAGNOSIS — R221 Localized swelling, mass and lump, neck: Secondary | ICD-10-CM | POA: Diagnosis not present

## 2020-04-19 DIAGNOSIS — C4330 Malignant melanoma of unspecified part of face: Secondary | ICD-10-CM | POA: Diagnosis not present

## 2020-04-19 DIAGNOSIS — E279 Disorder of adrenal gland, unspecified: Secondary | ICD-10-CM | POA: Diagnosis not present

## 2020-04-22 DIAGNOSIS — M9905 Segmental and somatic dysfunction of pelvic region: Secondary | ICD-10-CM | POA: Diagnosis not present

## 2020-04-22 DIAGNOSIS — M9902 Segmental and somatic dysfunction of thoracic region: Secondary | ICD-10-CM | POA: Diagnosis not present

## 2020-04-22 DIAGNOSIS — M9903 Segmental and somatic dysfunction of lumbar region: Secondary | ICD-10-CM | POA: Diagnosis not present

## 2020-04-22 DIAGNOSIS — M9901 Segmental and somatic dysfunction of cervical region: Secondary | ICD-10-CM | POA: Diagnosis not present

## 2020-04-22 DIAGNOSIS — R293 Abnormal posture: Secondary | ICD-10-CM | POA: Diagnosis not present

## 2020-04-24 DIAGNOSIS — R293 Abnormal posture: Secondary | ICD-10-CM | POA: Diagnosis not present

## 2020-04-24 DIAGNOSIS — M9902 Segmental and somatic dysfunction of thoracic region: Secondary | ICD-10-CM | POA: Diagnosis not present

## 2020-04-24 DIAGNOSIS — M9901 Segmental and somatic dysfunction of cervical region: Secondary | ICD-10-CM | POA: Diagnosis not present

## 2020-04-24 DIAGNOSIS — M9903 Segmental and somatic dysfunction of lumbar region: Secondary | ICD-10-CM | POA: Diagnosis not present

## 2020-04-24 DIAGNOSIS — M9905 Segmental and somatic dysfunction of pelvic region: Secondary | ICD-10-CM | POA: Diagnosis not present

## 2020-04-29 DIAGNOSIS — M9903 Segmental and somatic dysfunction of lumbar region: Secondary | ICD-10-CM | POA: Diagnosis not present

## 2020-04-29 DIAGNOSIS — M9905 Segmental and somatic dysfunction of pelvic region: Secondary | ICD-10-CM | POA: Diagnosis not present

## 2020-04-29 DIAGNOSIS — R293 Abnormal posture: Secondary | ICD-10-CM | POA: Diagnosis not present

## 2020-04-29 DIAGNOSIS — M9902 Segmental and somatic dysfunction of thoracic region: Secondary | ICD-10-CM | POA: Diagnosis not present

## 2020-04-29 DIAGNOSIS — M9901 Segmental and somatic dysfunction of cervical region: Secondary | ICD-10-CM | POA: Diagnosis not present

## 2020-05-06 DIAGNOSIS — M9902 Segmental and somatic dysfunction of thoracic region: Secondary | ICD-10-CM | POA: Diagnosis not present

## 2020-05-06 DIAGNOSIS — M9903 Segmental and somatic dysfunction of lumbar region: Secondary | ICD-10-CM | POA: Diagnosis not present

## 2020-05-06 DIAGNOSIS — M9905 Segmental and somatic dysfunction of pelvic region: Secondary | ICD-10-CM | POA: Diagnosis not present

## 2020-05-06 DIAGNOSIS — M9901 Segmental and somatic dysfunction of cervical region: Secondary | ICD-10-CM | POA: Diagnosis not present

## 2020-05-06 DIAGNOSIS — R293 Abnormal posture: Secondary | ICD-10-CM | POA: Diagnosis not present

## 2020-05-14 DIAGNOSIS — R293 Abnormal posture: Secondary | ICD-10-CM | POA: Diagnosis not present

## 2020-05-14 DIAGNOSIS — M9902 Segmental and somatic dysfunction of thoracic region: Secondary | ICD-10-CM | POA: Diagnosis not present

## 2020-05-14 DIAGNOSIS — M9901 Segmental and somatic dysfunction of cervical region: Secondary | ICD-10-CM | POA: Diagnosis not present

## 2020-05-14 DIAGNOSIS — M9903 Segmental and somatic dysfunction of lumbar region: Secondary | ICD-10-CM | POA: Diagnosis not present

## 2020-05-14 DIAGNOSIS — M9905 Segmental and somatic dysfunction of pelvic region: Secondary | ICD-10-CM | POA: Diagnosis not present

## 2020-06-05 DIAGNOSIS — M9905 Segmental and somatic dysfunction of pelvic region: Secondary | ICD-10-CM | POA: Diagnosis not present

## 2020-06-05 DIAGNOSIS — M9901 Segmental and somatic dysfunction of cervical region: Secondary | ICD-10-CM | POA: Diagnosis not present

## 2020-06-05 DIAGNOSIS — M9903 Segmental and somatic dysfunction of lumbar region: Secondary | ICD-10-CM | POA: Diagnosis not present

## 2020-06-05 DIAGNOSIS — R293 Abnormal posture: Secondary | ICD-10-CM | POA: Diagnosis not present

## 2020-06-05 DIAGNOSIS — M9902 Segmental and somatic dysfunction of thoracic region: Secondary | ICD-10-CM | POA: Diagnosis not present

## 2020-10-18 DIAGNOSIS — R221 Localized swelling, mass and lump, neck: Secondary | ICD-10-CM | POA: Diagnosis not present

## 2020-10-18 DIAGNOSIS — C4330 Malignant melanoma of unspecified part of face: Secondary | ICD-10-CM | POA: Diagnosis not present

## 2020-11-08 DIAGNOSIS — C4330 Malignant melanoma of unspecified part of face: Secondary | ICD-10-CM | POA: Diagnosis not present

## 2020-11-08 DIAGNOSIS — R590 Localized enlarged lymph nodes: Secondary | ICD-10-CM | POA: Diagnosis not present

## 2020-12-09 DIAGNOSIS — Z1389 Encounter for screening for other disorder: Secondary | ICD-10-CM | POA: Diagnosis not present

## 2020-12-09 DIAGNOSIS — I10 Essential (primary) hypertension: Secondary | ICD-10-CM | POA: Diagnosis not present

## 2020-12-09 DIAGNOSIS — Z1211 Encounter for screening for malignant neoplasm of colon: Secondary | ICD-10-CM | POA: Diagnosis not present

## 2020-12-09 DIAGNOSIS — Z85820 Personal history of malignant melanoma of skin: Secondary | ICD-10-CM | POA: Diagnosis not present

## 2020-12-09 DIAGNOSIS — E7800 Pure hypercholesterolemia, unspecified: Secondary | ICD-10-CM | POA: Diagnosis not present

## 2020-12-09 DIAGNOSIS — Z0000 Encounter for general adult medical examination without abnormal findings: Secondary | ICD-10-CM | POA: Diagnosis not present

## 2020-12-09 DIAGNOSIS — I700 Atherosclerosis of aorta: Secondary | ICD-10-CM | POA: Diagnosis not present

## 2020-12-13 DIAGNOSIS — Z85820 Personal history of malignant melanoma of skin: Secondary | ICD-10-CM | POA: Diagnosis not present

## 2020-12-13 DIAGNOSIS — Z08 Encounter for follow-up examination after completed treatment for malignant neoplasm: Secondary | ICD-10-CM | POA: Diagnosis not present

## 2020-12-13 DIAGNOSIS — C4330 Malignant melanoma of unspecified part of face: Secondary | ICD-10-CM | POA: Diagnosis not present

## 2020-12-13 DIAGNOSIS — Z9889 Other specified postprocedural states: Secondary | ICD-10-CM | POA: Diagnosis not present

## 2020-12-24 DIAGNOSIS — R7301 Impaired fasting glucose: Secondary | ICD-10-CM | POA: Diagnosis not present

## 2021-01-04 ENCOUNTER — Ambulatory Visit
Admission: EM | Admit: 2021-01-04 | Discharge: 2021-01-04 | Disposition: A | Discharge status: Home / Self Care | Payer: Medicare Other | Attending: Emergency Medicine | Admitting: Emergency Medicine

## 2021-01-04 ENCOUNTER — Other Ambulatory Visit: Payer: Self-pay

## 2021-01-04 ENCOUNTER — Encounter: Payer: Self-pay | Admitting: Emergency Medicine

## 2021-01-04 DIAGNOSIS — S40261A Insect bite (nonvenomous) of right shoulder, initial encounter: Secondary | ICD-10-CM

## 2021-01-04 DIAGNOSIS — W57XXXA Bitten or stung by nonvenomous insect and other nonvenomous arthropods, initial encounter: Secondary | ICD-10-CM

## 2021-01-04 MED ORDER — DOXYCYCLINE HYCLATE 100 MG PO CAPS: 100.0000 mg | ORAL_CAPSULE | Freq: Two times a day (BID) | ORAL | 0 refills | Status: AC

## 2021-01-04 MED ORDER — TRIAMCINOLONE ACETONIDE 0.1 % EX CREA: 1.0000 "application " | TOPICAL_CREAM | Freq: Two times a day (BID) | CUTANEOUS | 0 refills | Status: AC

## 2021-01-04 NOTE — ED Provider Notes (Signed)
[facility]    CSN: [id number] Arrival date & time: 01/04/21  1239      History   Chief Complaint Chief Complaint  Patient presents with   Insect Bite    HPI Matthew Hubbard is a 80 y.o. male history of hypertension, tobacco abuse presenting today for evaluation of insect bite.  Reports 3 areas to right shoulder which he believes were insect bites.  Since they have become confluent had not have developed a larger red area with associated itching and a pins and needle sensation.  Denies fevers chills body aches.  Denies dizziness or lightheadedness.  Denies nausea vomiting or diarrhea.  Denies rash elsewhere.  HPI  Past Medical History:  Diagnosis Date   Bradycardia    Hypertension    Non-compliance    Slurred speech    TIA (transient ischemic attack)    Tobacco abuse     Patient Active Problem List   Diagnosis Date Noted   Hypertension 09/08/2016   Hyperlipidemia 09/08/2016   Slurred speech 09/08/2016   Tobacco abuse    TIA (transient ischemic attack)     Past Surgical History:  Procedure Laterality Date   FRACTURE SURGERY  2009   neck       Home Medications    Prior to Admission medications   Medication Sig Start Date End Date Taking? Authorizing Provider  doxycycline (VIBRAMYCIN) 100 MG capsule Take 1 capsule (100 mg total) by mouth 2 (two) times daily for 7 days. 01/04/21 01/11/21 Yes Evamae Rowen C, PA-C  triamcinolone cream (KENALOG) 0.1 % Apply 1 application topically 2 (two) times daily. 01/04/21  Yes Jaquelynn Wanamaker C, PA-C  co-enzyme Q-10 30 MG capsule Take 30 mg by mouth 3 (three) times daily.    [provider]  magnesium 30 MG tablet Take 30 mg by mouth daily.    [provider]  metoprolol tartrate (LOPRESSOR) 25 MG tablet Take 25 mg by mouth 2 (two) times daily.    [provider]  Multiple Vitamin (MULTIVITAMIN) tablet Take 1 tablet by mouth daily.    [provider]  Omega-3 Fatty Acids  (FISH OIL) 1000 MG CAPS Take 1,000 mg by mouth daily.    [provider]  POTASSIUM CHLORIDE ER PO Take by mouth.    [provider]    Family History History reviewed. No pertinent family history.  Social History Social History   Tobacco Use   Smoking status: Former    Packs/day: 0.25    Years: 40.00    Pack years: 10.00    Types: Cigarettes    Quit date: 06/08/2016    Years since quitting: 4.5   Smokeless tobacco: Never  Substance Use Topics   Alcohol use: Yes    Alcohol/week: 0.0 standard drinks    Comment: 2 beers a week   Drug use: No     Allergies   Patient has no known allergies.   Review of Systems Review of Systems  Constitutional:  Negative for fatigue and fever.  Eyes:  Negative for redness, itching and visual disturbance.  Respiratory:  Negative for shortness of breath.   Cardiovascular:  Negative for chest pain and leg swelling.  Gastrointestinal:  Negative for nausea and vomiting.  Musculoskeletal:  Negative for arthralgias and myalgias.  Skin:  Positive for color change. Negative for rash and wound.  Neurological:  Negative for dizziness, syncope, weakness, light-headedness and headaches.    Physical Exam Triage Vital Signs ED Triage Vitals  Enc  Vitals Group     BP      Pulse      Resp      Temp      Temp src      SpO2      Weight      Height      Head Circumference      Peak Flow      Pain Score      Pain Loc      Pain Edu?      Excl. in [place]?    No data found.  Updated Vital Signs BP (!) 144/82 (BP Location: Left Arm)   Pulse 91   Temp 97.9 F (36.6 C) (Oral)   Resp 20   SpO2 97%   Visual Acuity Right Eye Distance:   Left Eye Distance:   Bilateral Distance:    Right Eye Near:   Left Eye Near:    Bilateral Near:     Physical Exam Vitals and nursing note reviewed.  Constitutional:      Appearance: He is well-developed.     Comments: No acute distress  HENT:     Head: Normocephalic and atraumatic.      Nose: Nose normal.  Eyes:     Conjunctiva/sclera: Conjunctivae normal.  Cardiovascular:     Rate and Rhythm: Normal rate.  Pulmonary:     Effort: Pulmonary effort is normal. No respiratory distress.  Abdominal:     General: There is no distension.  Musculoskeletal:        General: Normal range of motion.     Cervical back: Neck supple.     Comments: Full active range of motion of right shoulder  Skin:    General: Skin is warm and dry.     Comments: Superior right shoulder area with circular area of erythema and slight warmth, no induration or fluctuance  Neurological:     General: No focal deficit present.     Mental Status: He is alert and oriented to person, place, and time. Mental status is at baseline.     Cranial Nerves: No cranial nerve deficit.     Motor: No weakness.     Gait: Gait normal.     UC Treatments / Results  Labs (all labs ordered are listed, but only abnormal results are displayed) Labs Reviewed - No data to display  EKG   Radiology No results found.  Procedures Procedures (including critical care time)  Medications Ordered in UC Medications - No data to display  Initial Impression / Assessment and Plan / UC Course  I have reviewed the triage vital signs and the nursing notes.  Pertinent labs & imaging results that were available during my care of the patient were reviewed by me and considered in my medical decision making (see chart for details).     Likely localized reaction/inflammation from insect bite, lower suspicion of tick at this time, recommending triamcinolone topically, placing on doxycycline to cover for skin infection.  Ice and continued monitoring.  Discussed strict return precautions. Patient verbalized understanding and is agreeable with plan.  Final Clinical Impressions(s) / UC Diagnoses   Final diagnoses:  Insect bite of right shoulder, initial encounter     Discharge Instructions      Please use triamcinolone cream  twice daily to area to help with localized swelling inflammation and itching Doxycycline twice daily to cover infection Ice Follow-up if not improving or worsening     ED Prescriptions  Medication Sig Dispense Auth. Provider   triamcinolone cream (KENALOG) 0.1 % Apply 1 application topically 2 (two) times daily. 45 g Chealsey Miyamoto C, PA-C   doxycycline (VIBRAMYCIN) 100 MG capsule Take 1 capsule (100 mg total) by mouth 2 (two) times daily for 7 days. 14 capsule Sunshyne Horvath, [place] C, PA-C      PDMP not reviewed this encounter.   Janith Lima, PA-C 01/04/21 1345

## 2021-01-04 NOTE — Discharge Instructions (Addendum)
Please use triamcinolone cream twice daily to area to help with localized swelling inflammation and itching Doxycycline twice daily to cover infection Ice Follow-up if not improving or worsening

## 2021-01-04 NOTE — ED Triage Notes (Signed)
Pt here for insect bite to right shoulder with redness noted yesterday

## 2021-04-18 DIAGNOSIS — R911 Solitary pulmonary nodule: Secondary | ICD-10-CM | POA: Diagnosis not present

## 2021-04-18 DIAGNOSIS — K7689 Other specified diseases of liver: Secondary | ICD-10-CM | POA: Diagnosis not present

## 2021-04-18 DIAGNOSIS — E278 Other specified disorders of adrenal gland: Secondary | ICD-10-CM | POA: Diagnosis not present

## 2021-04-18 DIAGNOSIS — N2889 Other specified disorders of kidney and ureter: Secondary | ICD-10-CM | POA: Diagnosis not present

## 2021-04-18 DIAGNOSIS — C4330 Malignant melanoma of unspecified part of face: Secondary | ICD-10-CM | POA: Diagnosis not present

## 2021-04-18 DIAGNOSIS — Z85828 Personal history of other malignant neoplasm of skin: Secondary | ICD-10-CM | POA: Diagnosis not present

## 2021-04-18 DIAGNOSIS — Z08 Encounter for follow-up examination after completed treatment for malignant neoplasm: Secondary | ICD-10-CM | POA: Diagnosis not present

## 2021-04-18 DIAGNOSIS — R918 Other nonspecific abnormal finding of lung field: Secondary | ICD-10-CM | POA: Diagnosis not present

## 2021-05-05 DIAGNOSIS — H04123 Dry eye syndrome of bilateral lacrimal glands: Secondary | ICD-10-CM | POA: Diagnosis not present

## 2021-05-05 DIAGNOSIS — H0288B Meibomian gland dysfunction left eye, upper and lower eyelids: Secondary | ICD-10-CM | POA: Diagnosis not present

## 2021-05-05 DIAGNOSIS — H2513 Age-related nuclear cataract, bilateral: Secondary | ICD-10-CM | POA: Diagnosis not present

## 2021-05-05 DIAGNOSIS — L719 Rosacea, unspecified: Secondary | ICD-10-CM | POA: Diagnosis not present

## 2021-05-05 DIAGNOSIS — H0102A Squamous blepharitis right eye, upper and lower eyelids: Secondary | ICD-10-CM | POA: Diagnosis not present

## 2021-05-05 DIAGNOSIS — H0288A Meibomian gland dysfunction right eye, upper and lower eyelids: Secondary | ICD-10-CM | POA: Diagnosis not present

## 2021-05-05 DIAGNOSIS — H0102B Squamous blepharitis left eye, upper and lower eyelids: Secondary | ICD-10-CM | POA: Diagnosis not present

## 2021-05-14 DIAGNOSIS — Q825 Congenital non-neoplastic nevus: Secondary | ICD-10-CM | POA: Diagnosis not present

## 2021-05-14 DIAGNOSIS — L578 Other skin changes due to chronic exposure to nonionizing radiation: Secondary | ICD-10-CM | POA: Diagnosis not present

## 2021-05-14 DIAGNOSIS — L814 Other melanin hyperpigmentation: Secondary | ICD-10-CM | POA: Diagnosis not present

## 2021-05-14 DIAGNOSIS — Z23 Encounter for immunization: Secondary | ICD-10-CM | POA: Diagnosis not present

## 2021-05-14 DIAGNOSIS — L821 Other seborrheic keratosis: Secondary | ICD-10-CM | POA: Diagnosis not present

## 2021-05-14 DIAGNOSIS — D1801 Hemangioma of skin and subcutaneous tissue: Secondary | ICD-10-CM | POA: Diagnosis not present

## 2021-05-14 DIAGNOSIS — Z85820 Personal history of malignant melanoma of skin: Secondary | ICD-10-CM | POA: Diagnosis not present

## 2021-05-29 DIAGNOSIS — R194 Change in bowel habit: Secondary | ICD-10-CM | POA: Diagnosis not present

## 2021-05-29 DIAGNOSIS — Z1211 Encounter for screening for malignant neoplasm of colon: Secondary | ICD-10-CM | POA: Diagnosis not present

## 2021-10-24 DIAGNOSIS — Z872 Personal history of diseases of the skin and subcutaneous tissue: Secondary | ICD-10-CM | POA: Diagnosis not present

## 2021-10-24 DIAGNOSIS — Z08 Encounter for follow-up examination after completed treatment for malignant neoplasm: Secondary | ICD-10-CM | POA: Diagnosis not present

## 2021-10-24 DIAGNOSIS — C4330 Malignant melanoma of unspecified part of face: Secondary | ICD-10-CM | POA: Diagnosis not present

## 2021-11-12 DIAGNOSIS — Q825 Congenital non-neoplastic nevus: Secondary | ICD-10-CM | POA: Diagnosis not present

## 2021-11-12 DIAGNOSIS — L719 Rosacea, unspecified: Secondary | ICD-10-CM | POA: Diagnosis not present

## 2021-11-12 DIAGNOSIS — D229 Melanocytic nevi, unspecified: Secondary | ICD-10-CM | POA: Diagnosis not present

## 2021-11-12 DIAGNOSIS — Z85820 Personal history of malignant melanoma of skin: Secondary | ICD-10-CM | POA: Diagnosis not present

## 2021-11-12 DIAGNOSIS — L814 Other melanin hyperpigmentation: Secondary | ICD-10-CM | POA: Diagnosis not present

## 2021-11-12 DIAGNOSIS — L821 Other seborrheic keratosis: Secondary | ICD-10-CM | POA: Diagnosis not present

## 2021-11-12 DIAGNOSIS — I872 Venous insufficiency (chronic) (peripheral): Secondary | ICD-10-CM | POA: Diagnosis not present

## 2021-11-12 DIAGNOSIS — L578 Other skin changes due to chronic exposure to nonionizing radiation: Secondary | ICD-10-CM | POA: Diagnosis not present

## 2021-11-12 DIAGNOSIS — D1801 Hemangioma of skin and subcutaneous tissue: Secondary | ICD-10-CM | POA: Diagnosis not present

## 2021-11-12 DIAGNOSIS — L309 Dermatitis, unspecified: Secondary | ICD-10-CM | POA: Diagnosis not present

## 2021-12-11 DIAGNOSIS — E7800 Pure hypercholesterolemia, unspecified: Secondary | ICD-10-CM | POA: Diagnosis not present

## 2021-12-11 DIAGNOSIS — I10 Essential (primary) hypertension: Secondary | ICD-10-CM | POA: Diagnosis not present

## 2021-12-11 DIAGNOSIS — I700 Atherosclerosis of aorta: Secondary | ICD-10-CM | POA: Diagnosis not present

## 2021-12-11 DIAGNOSIS — Z0000 Encounter for general adult medical examination without abnormal findings: Secondary | ICD-10-CM | POA: Diagnosis not present

## 2021-12-11 DIAGNOSIS — Z85820 Personal history of malignant melanoma of skin: Secondary | ICD-10-CM | POA: Diagnosis not present

## 2022-03-09 ENCOUNTER — Encounter: Payer: Self-pay | Admitting: Physician Assistant

## 2022-03-09 ENCOUNTER — Ambulatory Visit
Admission: EM | Admit: 2022-03-09 | Discharge: 2022-03-09 | Disposition: A | Discharge status: Home / Self Care | Payer: Medicare Other | Attending: Physician Assistant | Admitting: Physician Assistant

## 2022-03-09 DIAGNOSIS — M5442 Lumbago with sciatica, left side: Secondary | ICD-10-CM | POA: Diagnosis not present

## 2022-03-09 MED ORDER — TIZANIDINE HCL 4 MG PO TABS: 4.0000 mg | ORAL_TABLET | Freq: Four times a day (QID) | ORAL | 0 refills | Status: AC | PRN

## 2022-03-09 MED ORDER — HYDROCODONE-ACETAMINOPHEN 5-325 MG PO TABS: 1.0000 | ORAL_TABLET | ORAL | 0 refills | Status: AC | PRN

## 2022-03-09 NOTE — ED Provider Notes (Signed)
[facility]    CSN: [id number] Arrival date & time: 03/09/22  1422      History   Chief Complaint Chief Complaint  Patient presents with   back pain     HPI Matthew Hubbard is a 81 y.o. male.   Patient here today for evaluation of back pain that he states started a couple days ago and seems to be worse today.  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  He has not had any dysuria or frequency of urination.  Movement/standing seems to make pain worse. He has taken OTC meds without improvement. He has taken hydrocodone with the most improvement.  The history is provided by the patient.    Past Medical History:  Diagnosis Date   Bradycardia    Hypertension    Non-compliance    Slurred speech    TIA (transient ischemic attack)    Tobacco abuse     Patient Active Problem List   Diagnosis Date Noted   Hypertension 09/08/2016   Hyperlipidemia 09/08/2016   Slurred speech 09/08/2016   Tobacco abuse    TIA (transient ischemic attack)     Past Surgical History:  Procedure Laterality Date   FRACTURE SURGERY  2009   neck       Home Medications    Prior to Admission medications   Medication Sig Start Date End Date Taking? Authorizing Provider  HYDROcodone-acetaminophen (NORCO/VICODIN) 5-325 MG tablet Take 1 tablet by mouth every 4 (four) hours as needed for up to 3 days. 03/09/22 03/12/22 Yes Francene Finders, PA-C  tiZANidine (ZANAFLEX) 4 MG tablet Take 1 tablet (4 mg total) by mouth every 6 (six) hours as needed for muscle spasms. 03/09/22  Yes Francene Finders, PA-C  co-enzyme Q-10 30 MG capsule Take 30 mg by mouth 3 (three) times daily.    [provider]  magnesium 30 MG tablet Take 30 mg by mouth daily.    [provider]  metoprolol tartrate (LOPRESSOR) 25 MG tablet Take 25 mg by mouth 2 (two) times daily.    [provider]  Multiple Vitamin (MULTIVITAMIN) tablet Take 1 tablet by mouth daily.    [provider]  Omega-3 Fatty Acids (FISH OIL) 1000 MG CAPS Take 1,000 mg by mouth daily.    [provider]  POTASSIUM CHLORIDE ER PO Take by mouth.    [provider]  triamcinolone cream (KENALOG) 0.1 % Apply 1 application topically 2 (two) times daily. 01/04/21   Wieters, Elesa Hacker, PA-C    Family History History reviewed. No pertinent family history.  Social History Social History   Tobacco Use   Smoking status: Former    Packs/day: 0.25    Years: 40.00    Total pack years: 10.00    Types: Cigarettes    Quit date: 06/08/2016    Years since quitting: 5.7   Smokeless tobacco: Never  Substance Use Topics   Alcohol use: Yes    Alcohol/week: 0.0 standard drinks of alcohol    Comment: 2 beers a week   Drug use: No     Allergies   Patient has no known allergies.   Review of Systems Review of Systems  Constitutional:  Negative for chills and fever.  Eyes:  Negative for discharge and redness.  Musculoskeletal:  Positive for back pain and myalgias.  Neurological:  Negative for numbness.     Physical Exam Triage Vital Signs ED Triage Vitals [03/09/22 1434]  Enc  Vitals Group     BP      Pulse      Resp      Temp      Temp src      SpO2      Weight      Height      Head Circumference      Peak Flow      Pain Score 8     Pain Loc      Pain Edu?      Excl. in [place]?    No data found.  Updated Vital Signs BP (!) 164/82   Pulse 67   Temp 97.9 F (36.6 C)   Resp 18   SpO2 95%      Physical Exam Vitals and nursing note reviewed.  Constitutional:      General: He is not in acute distress.    Appearance: Normal appearance. He is not ill-appearing.  HENT:     Head: Normocephalic and atraumatic.  Eyes:     Conjunctiva/sclera: Conjunctivae normal.  Cardiovascular:     Rate and Rhythm: Normal rate.  Pulmonary:     Effort: Pulmonary effort is normal.  Musculoskeletal:     Comments: No midline spine TTP or TTP to low back  Neurological:      Mental Status: He is alert.  Psychiatric:        Mood and Affect: Mood normal.        Behavior: Behavior normal.        Thought Content: Thought content normal.      UC Treatments / Results  Labs (all labs ordered are listed, but only abnormal results are displayed) Labs Reviewed - No data to display  EKG   Radiology No results found.  Procedures Procedures (including critical care time)  Medications Ordered in UC Medications - No data to display  Initial Impression / Assessment and Plan / UC Course  I have reviewed the triage vital signs and the nursing notes.  Pertinent labs & imaging results that were available during my care of the patient were reviewed by me and considered in my medical decision making (see chart for details).   Muscle relaxer prescribed along with low-dose hydrocodone for short amount of patient's request.  Recommended further evaluation if no gradual improvement or symptoms worsen.   Final Clinical Impressions(s) / UC Diagnoses   Final diagnoses:  Acute left-sided low back pain with left-sided sciatica   Discharge Instructions   None    ED Prescriptions     Medication Sig Dispense Auth. Provider   HYDROcodone-acetaminophen (NORCO/VICODIN) 5-325 MG tablet Take 1 tablet by mouth every 4 (four) hours as needed for up to 3 days. 10 tablet Ewell Poe F, PA-C   tiZANidine (ZANAFLEX) 4 MG tablet Take 1 tablet (4 mg total) by mouth every 6 (six) hours as needed for muscle spasms. 30 tablet Francene Finders, PA-C      I have reviewed the PDMP during this encounter.   Francene Finders, PA-C 03/09/22 [phone]

## 2022-03-09 NOTE — ED Triage Notes (Addendum)
Pt presents to uc with co of back pain he has been taking motrin for the pain but pain is still 8/10. No frequency of urination or dysuria.

## 2022-04-24 DIAGNOSIS — Z85820 Personal history of malignant melanoma of skin: Secondary | ICD-10-CM | POA: Diagnosis not present

## 2022-04-24 DIAGNOSIS — C4339 Malignant melanoma of other parts of face: Secondary | ICD-10-CM | POA: Diagnosis not present

## 2022-04-24 DIAGNOSIS — R918 Other nonspecific abnormal finding of lung field: Secondary | ICD-10-CM | POA: Diagnosis not present

## 2022-04-24 DIAGNOSIS — N281 Cyst of kidney, acquired: Secondary | ICD-10-CM | POA: Diagnosis not present

## 2022-04-24 DIAGNOSIS — C4330 Malignant melanoma of unspecified part of face: Secondary | ICD-10-CM | POA: Diagnosis not present

## 2022-05-05 ENCOUNTER — Ambulatory Visit
Admission: EM | Admit: 2022-05-05 | Discharge: 2022-05-05 | Disposition: A | Discharge status: Home / Self Care | Payer: Medicare Other

## 2022-05-05 DIAGNOSIS — I10 Essential (primary) hypertension: Secondary | ICD-10-CM

## 2022-05-05 NOTE — ED Triage Notes (Signed)
Pt presents with elevated blood pressure that is not relieved with prescribed medication.

## 2022-05-05 NOTE — ED Provider Notes (Signed)
[facility]    CSN: [id number] Arrival date & time: 05/05/22  1336      History   Chief Complaint Chief Complaint  Patient presents with   Hypertension    HPI Matthew Hubbard is a 81 y.o. male.   Patient here today for evaluation of elevated blood pressure that he noted last night.  He states that he did stop taking the turmeric that he was taking and today blood pressure has been better.  He denies any chest pain or shortness of breath.  He has not taken any other medications.  He does have metoprolol for use as needed.  The history is provided by the patient.  Hypertension Pertinent negatives include no chest pain and no shortness of breath.    Past Medical History:  Diagnosis Date   Bradycardia    Hypertension    Non-compliance    Slurred speech    TIA (transient ischemic attack)    Tobacco abuse     Patient Active Problem List   Diagnosis Date Noted   Hypertension 09/08/2016   Hyperlipidemia 09/08/2016   Slurred speech 09/08/2016   Tobacco abuse    TIA (transient ischemic attack)     Past Surgical History:  Procedure Laterality Date   FRACTURE SURGERY  2009   neck       Home Medications    Prior to Admission medications   Medication Sig Start Date End Date Taking? Authorizing Provider  metoprolol succinate (TOPROL-XL) 25 MG 24 hr tablet Take 25 mg by mouth daily.   Yes [provider]  co-enzyme Q-10 30 MG capsule Take 30 mg by mouth 3 (three) times daily.    [provider]  magnesium 30 MG tablet Take 30 mg by mouth daily.    [provider]  metoprolol tartrate (LOPRESSOR) 25 MG tablet Take 25 mg by mouth 2 (two) times daily.    [provider]  Multiple Vitamin (MULTIVITAMIN) tablet Take 1 tablet by mouth daily.    [provider]  Omega-3 Fatty Acids (FISH OIL) 1000 MG CAPS Take 1,000 mg by mouth daily.    [provider]  POTASSIUM CHLORIDE ER PO Take by mouth.     [provider]  tiZANidine (ZANAFLEX) 4 MG tablet Take 1 tablet (4 mg total) by mouth every 6 (six) hours as needed for muscle spasms. 03/09/22   Francene Finders, PA-C  triamcinolone cream (KENALOG) 0.1 % Apply 1 application topically 2 (two) times daily. 01/04/21   Wieters, Elesa Hacker, PA-C    Family History History reviewed. No pertinent family history.  Social History Social History   Tobacco Use   Smoking status: Former    Packs/day: 0.25    Years: 40.00    Total pack years: 10.00    Types: Cigarettes    Quit date: 06/08/2016    Years since quitting: 5.9   Smokeless tobacco: Never  Substance Use Topics   Alcohol use: Yes    Alcohol/week: 0.0 standard drinks of alcohol    Comment: 2 beers a week   Drug use: No     Allergies   Patient has no known allergies.   Review of Systems Review of Systems  Constitutional:  Negative for chills and fever.  Eyes:  Negative for discharge and redness.  Respiratory:  Negative for shortness of breath.   Cardiovascular:  Negative for chest pain.  Neurological:  Negative for numbness.     Physical Exam Triage Vital Signs ED  Triage Vitals  Enc Vitals Group     BP 05/05/22 1346 (!) 148/83     Pulse Rate 05/05/22 1346 (!) 57     Resp 05/05/22 1346 17     Temp 05/05/22 1346 97.7 F (36.5 C)     Temp Source 05/05/22 1346 Oral     SpO2 05/05/22 1346 94 %     Weight --      Height --      Head Circumference --      Peak Flow --      Pain Score 05/05/22 1345 0     Pain Loc --      Pain Edu? --      Excl. in [place]? --    No data found.  Updated Vital Signs BP (!) 148/83 (BP Location: Left Arm)   Pulse (!) 57   Temp 97.7 F (36.5 C) (Oral)   Resp 17   SpO2 94%     Physical Exam Vitals and nursing note reviewed.  Constitutional:      General: He is not in acute distress.    Appearance: Normal appearance. He is not ill-appearing.  HENT:     Head: Normocephalic and atraumatic.  Eyes:     Conjunctiva/sclera:  Conjunctivae normal.  Cardiovascular:     Rate and Rhythm: Normal rate and regular rhythm.     Heart sounds: Normal heart sounds.  Pulmonary:     Effort: Pulmonary effort is normal. No respiratory distress.     Breath sounds: Normal breath sounds.  Neurological:     Mental Status: He is alert.  Psychiatric:        Mood and Affect: Mood normal.        Behavior: Behavior normal.        Thought Content: Thought content normal.      UC Treatments / Results  Labs (all labs ordered are listed, but only abnormal results are displayed) Labs Reviewed - No data to display  EKG   Radiology No results found.  Procedures Procedures (including critical care time)  Medications Ordered in UC Medications - No data to display  Initial Impression / Assessment and Plan / UC Course  I have reviewed the triage vital signs and the nursing notes.  Pertinent labs & imaging results that were available during my care of the patient were reviewed by me and considered in my medical decision making (see chart for details).    Blood pressure in office not significantly elevated, recommended further evaluation by her primary care provider for further question regarding elevated blood pressure.  Recommended he discontinue turmeric as this discontinuation seems to have helped blood pressure readings.  Final Clinical Impressions(s) / UC Diagnoses   Final diagnoses:  Hypertension, unspecified type   Discharge Instructions   None    ED Prescriptions   None    PDMP not reviewed this encounter.   Francene Finders, PA-C 05/05/22 2000

## 2022-05-13 DIAGNOSIS — I10 Essential (primary) hypertension: Secondary | ICD-10-CM | POA: Diagnosis not present

## 2022-05-14 DIAGNOSIS — Z85820 Personal history of malignant melanoma of skin: Secondary | ICD-10-CM | POA: Diagnosis not present

## 2022-05-14 DIAGNOSIS — D229 Melanocytic nevi, unspecified: Secondary | ICD-10-CM | POA: Diagnosis not present

## 2022-05-14 DIAGNOSIS — L578 Other skin changes due to chronic exposure to nonionizing radiation: Secondary | ICD-10-CM | POA: Diagnosis not present

## 2022-05-14 DIAGNOSIS — L209 Atopic dermatitis, unspecified: Secondary | ICD-10-CM | POA: Diagnosis not present

## 2022-05-14 DIAGNOSIS — L814 Other melanin hyperpigmentation: Secondary | ICD-10-CM | POA: Diagnosis not present

## 2022-05-14 DIAGNOSIS — L821 Other seborrheic keratosis: Secondary | ICD-10-CM | POA: Diagnosis not present

## 2022-05-14 DIAGNOSIS — Q825 Congenital non-neoplastic nevus: Secondary | ICD-10-CM | POA: Diagnosis not present

## 2022-10-19 ENCOUNTER — Ambulatory Visit
Admission: EM | Admit: 2022-10-19 | Discharge: 2022-10-19 | Disposition: A | Discharge status: Home / Self Care | Payer: Medicare Other

## 2022-10-19 DIAGNOSIS — H6123 Impacted cerumen, bilateral: Secondary | ICD-10-CM | POA: Diagnosis not present

## 2022-10-19 NOTE — Discharge Instructions (Addendum)
Return if any problems.

## 2022-10-19 NOTE — ED Triage Notes (Signed)
Pt reports trying to get hearing aids but needs his ears cleaned first.

## 2022-10-19 NOTE — ED Provider Notes (Signed)
[facility]    CSN: [id number] Arrival date & time: 10/19/22  1550      History   Chief Complaint Chief Complaint  Patient presents with   ear cleaning    HPI Matthew Hubbard is a 82 y.o. male.   Patient reports that he has wax in both ears.  Patient went to get hearing aids and he was told that he needed to have his ears cleaned out before he could have evaluation for hearing aids.  He reports he has begun having increasing difficulty hearing  The history is provided by the patient. No language interpreter was used.    Past Medical History:  Diagnosis Date   Bradycardia    Hypertension    Non-compliance    Slurred speech    TIA (transient ischemic attack)    Tobacco abuse     Patient Active Problem List   Diagnosis Date Noted   Hypertension 09/08/2016   Hyperlipidemia 09/08/2016   Slurred speech 09/08/2016   Tobacco abuse    TIA (transient ischemic attack)     Past Surgical History:  Procedure Laterality Date   FRACTURE SURGERY  2009   neck       Home Medications    Prior to Admission medications   Medication Sig Start Date End Date Taking? Authorizing Provider  co-enzyme Q-10 30 MG capsule Take 30 mg by mouth 3 (three) times daily.    [provider]  magnesium 30 MG tablet Take 30 mg by mouth daily.    [provider]  metoprolol succinate (TOPROL-XL) 25 MG 24 hr tablet Take 25 mg by mouth daily.    [provider]  metoprolol tartrate (LOPRESSOR) 25 MG tablet Take 25 mg by mouth 2 (two) times daily.    [provider]  Multiple Vitamin (MULTIVITAMIN) tablet Take 1 tablet by mouth daily.    [provider]  Omega-3 Fatty Acids (FISH OIL) 1000 MG CAPS Take 1,000 mg by mouth daily.    [provider]  POTASSIUM CHLORIDE ER PO Take by mouth.    [provider]  tiZANidine (ZANAFLEX) 4 MG tablet Take 1 tablet (4 mg total) by mouth every 6 (six) hours as needed for muscle  spasms. 03/09/22   Francene Finders, PA-C  triamcinolone cream (KENALOG) 0.1 % Apply 1 application topically 2 (two) times daily. 01/04/21   Wieters, Elesa Hacker, PA-C    Family History History reviewed. No pertinent family history.  Social History Social History   Tobacco Use   Smoking status: Former    Packs/day: 0.25    Years: 40.00    Additional pack years: 0.00    Total pack years: 10.00    Types: Cigarettes    Quit date: 06/08/2016    Years since quitting: 6.3   Smokeless tobacco: Never  Substance Use Topics   Alcohol use: Yes    Alcohol/week: 0.0 standard drinks of alcohol    Comment: 2 beers a week   Drug use: No     Allergies   Patient has no known allergies.   Review of Systems Review of Systems  HENT:  Positive for ear discharge and ear pain.   All other systems reviewed and are negative.    Physical Exam Triage Vital Signs ED Triage Vitals [10/19/22 1729]  Enc Vitals Group     BP (!) 165/81     Pulse Rate (!) 55     Resp 18     Temp 97.6  F (36.4 C)     Temp Source Oral     SpO2 96 %     Weight      Height      Head Circumference      Peak Flow      Pain Score 0     Pain Loc      Pain Edu?      Excl. in [place]?    No data found.  Updated Vital Signs BP (!) 165/81 (BP Location: Left Arm)   Pulse (!) 55   Temp 97.6 F (36.4 C) (Oral)   Resp 18   SpO2 96%   Visual Acuity Right Eye Distance:   Left Eye Distance:   Bilateral Distance:    Right Eye Near:   Left Eye Near:    Bilateral Near:     Physical Exam Vitals and nursing note reviewed.  Constitutional:      Appearance: He is well-developed.  HENT:     Head: Normocephalic.     Right Ear: There is impacted cerumen.     Left Ear: There is impacted cerumen.  Pulmonary:     Effort: Pulmonary effort is normal.  Abdominal:     General: There is no distension.  Musculoskeletal:        General: Normal range of motion.     Cervical back: Normal range of motion.  Neurological:      Mental Status: He is alert and oriented to person, place, and time.      UC Treatments / Results  Labs (all labs ordered are listed, but only abnormal results are displayed) Labs Reviewed - No data to display  EKG   Radiology No results found.  Procedures Procedures (including critical care time)  Medications Ordered in UC Medications - No data to display  Initial Impression / Assessment and Plan / UC Course  I have reviewed the triage vital signs and the nursing notes.  Pertinent labs & imaging results that were available during my care of the patient were reviewed by me and considered in my medical decision making (see chart for details).     Bilateral TMs irrigated with warm water and peroxide.  Patient reports feeling better after irrigation Final Clinical Impressions(s) / UC Diagnoses   Final diagnoses:  Bilateral impacted cerumen     Discharge Instructions      Return if any problems     ED Prescriptions   None    PDMP not reviewed this encounter. Marland Kitchenavs   Fransico Meadow, [place] 10/19/22 [id number]

## 2022-10-23 DIAGNOSIS — N281 Cyst of kidney, acquired: Secondary | ICD-10-CM | POA: Diagnosis not present

## 2022-10-23 DIAGNOSIS — I2510 Atherosclerotic heart disease of native coronary artery without angina pectoris: Secondary | ICD-10-CM | POA: Diagnosis not present

## 2022-10-23 DIAGNOSIS — Z85820 Personal history of malignant melanoma of skin: Secondary | ICD-10-CM | POA: Diagnosis not present

## 2022-10-23 DIAGNOSIS — C4339 Malignant melanoma of other parts of face: Secondary | ICD-10-CM | POA: Diagnosis not present

## 2022-10-23 DIAGNOSIS — C4330 Malignant melanoma of unspecified part of face: Secondary | ICD-10-CM | POA: Diagnosis not present

## 2022-10-29 DIAGNOSIS — M546 Pain in thoracic spine: Secondary | ICD-10-CM | POA: Diagnosis not present

## 2022-10-29 DIAGNOSIS — M9901 Segmental and somatic dysfunction of cervical region: Secondary | ICD-10-CM | POA: Diagnosis not present

## 2022-10-29 DIAGNOSIS — M545 Low back pain, unspecified: Secondary | ICD-10-CM | POA: Diagnosis not present

## 2022-10-29 DIAGNOSIS — M542 Cervicalgia: Secondary | ICD-10-CM | POA: Diagnosis not present

## 2022-10-29 DIAGNOSIS — M9903 Segmental and somatic dysfunction of lumbar region: Secondary | ICD-10-CM | POA: Diagnosis not present

## 2022-10-29 DIAGNOSIS — M9902 Segmental and somatic dysfunction of thoracic region: Secondary | ICD-10-CM | POA: Diagnosis not present

## 2022-11-05 DIAGNOSIS — M542 Cervicalgia: Secondary | ICD-10-CM | POA: Diagnosis not present

## 2022-11-05 DIAGNOSIS — M9901 Segmental and somatic dysfunction of cervical region: Secondary | ICD-10-CM | POA: Diagnosis not present

## 2022-11-05 DIAGNOSIS — M546 Pain in thoracic spine: Secondary | ICD-10-CM | POA: Diagnosis not present

## 2022-11-05 DIAGNOSIS — M9903 Segmental and somatic dysfunction of lumbar region: Secondary | ICD-10-CM | POA: Diagnosis not present

## 2022-11-05 DIAGNOSIS — M545 Low back pain, unspecified: Secondary | ICD-10-CM | POA: Diagnosis not present

## 2022-11-05 DIAGNOSIS — M9902 Segmental and somatic dysfunction of thoracic region: Secondary | ICD-10-CM | POA: Diagnosis not present

## 2022-11-09 DIAGNOSIS — M9903 Segmental and somatic dysfunction of lumbar region: Secondary | ICD-10-CM | POA: Diagnosis not present

## 2022-11-09 DIAGNOSIS — M545 Low back pain, unspecified: Secondary | ICD-10-CM | POA: Diagnosis not present

## 2022-11-09 DIAGNOSIS — M9901 Segmental and somatic dysfunction of cervical region: Secondary | ICD-10-CM | POA: Diagnosis not present

## 2022-11-09 DIAGNOSIS — M546 Pain in thoracic spine: Secondary | ICD-10-CM | POA: Diagnosis not present

## 2022-11-09 DIAGNOSIS — M9902 Segmental and somatic dysfunction of thoracic region: Secondary | ICD-10-CM | POA: Diagnosis not present

## 2022-11-09 DIAGNOSIS — M542 Cervicalgia: Secondary | ICD-10-CM | POA: Diagnosis not present

## 2022-11-11 DIAGNOSIS — M545 Low back pain, unspecified: Secondary | ICD-10-CM | POA: Diagnosis not present

## 2022-11-11 DIAGNOSIS — M9901 Segmental and somatic dysfunction of cervical region: Secondary | ICD-10-CM | POA: Diagnosis not present

## 2022-11-11 DIAGNOSIS — M542 Cervicalgia: Secondary | ICD-10-CM | POA: Diagnosis not present

## 2022-11-11 DIAGNOSIS — M546 Pain in thoracic spine: Secondary | ICD-10-CM | POA: Diagnosis not present

## 2022-11-11 DIAGNOSIS — M9902 Segmental and somatic dysfunction of thoracic region: Secondary | ICD-10-CM | POA: Diagnosis not present

## 2022-11-11 DIAGNOSIS — M9903 Segmental and somatic dysfunction of lumbar region: Secondary | ICD-10-CM | POA: Diagnosis not present

## 2022-11-12 DIAGNOSIS — M9902 Segmental and somatic dysfunction of thoracic region: Secondary | ICD-10-CM | POA: Diagnosis not present

## 2022-11-12 DIAGNOSIS — M546 Pain in thoracic spine: Secondary | ICD-10-CM | POA: Diagnosis not present

## 2022-11-12 DIAGNOSIS — M545 Low back pain, unspecified: Secondary | ICD-10-CM | POA: Diagnosis not present

## 2022-11-12 DIAGNOSIS — M9903 Segmental and somatic dysfunction of lumbar region: Secondary | ICD-10-CM | POA: Diagnosis not present

## 2022-11-12 DIAGNOSIS — M9901 Segmental and somatic dysfunction of cervical region: Secondary | ICD-10-CM | POA: Diagnosis not present

## 2022-11-12 DIAGNOSIS — M542 Cervicalgia: Secondary | ICD-10-CM | POA: Diagnosis not present

## 2022-11-16 DIAGNOSIS — L578 Other skin changes due to chronic exposure to nonionizing radiation: Secondary | ICD-10-CM | POA: Diagnosis not present

## 2022-11-16 DIAGNOSIS — D1801 Hemangioma of skin and subcutaneous tissue: Secondary | ICD-10-CM | POA: Diagnosis not present

## 2022-11-16 DIAGNOSIS — M9903 Segmental and somatic dysfunction of lumbar region: Secondary | ICD-10-CM | POA: Diagnosis not present

## 2022-11-16 DIAGNOSIS — L821 Other seborrheic keratosis: Secondary | ICD-10-CM | POA: Diagnosis not present

## 2022-11-16 DIAGNOSIS — M9902 Segmental and somatic dysfunction of thoracic region: Secondary | ICD-10-CM | POA: Diagnosis not present

## 2022-11-16 DIAGNOSIS — L814 Other melanin hyperpigmentation: Secondary | ICD-10-CM | POA: Diagnosis not present

## 2022-11-16 DIAGNOSIS — M545 Low back pain, unspecified: Secondary | ICD-10-CM | POA: Diagnosis not present

## 2022-11-16 DIAGNOSIS — Z85820 Personal history of malignant melanoma of skin: Secondary | ICD-10-CM | POA: Diagnosis not present

## 2022-11-16 DIAGNOSIS — M9901 Segmental and somatic dysfunction of cervical region: Secondary | ICD-10-CM | POA: Diagnosis not present

## 2022-11-16 DIAGNOSIS — D229 Melanocytic nevi, unspecified: Secondary | ICD-10-CM | POA: Diagnosis not present

## 2022-11-16 DIAGNOSIS — M546 Pain in thoracic spine: Secondary | ICD-10-CM | POA: Diagnosis not present

## 2022-11-16 DIAGNOSIS — M542 Cervicalgia: Secondary | ICD-10-CM | POA: Diagnosis not present

## 2022-11-16 DIAGNOSIS — Q825 Congenital non-neoplastic nevus: Secondary | ICD-10-CM | POA: Diagnosis not present

## 2022-11-18 DIAGNOSIS — M9902 Segmental and somatic dysfunction of thoracic region: Secondary | ICD-10-CM | POA: Diagnosis not present

## 2022-11-18 DIAGNOSIS — M542 Cervicalgia: Secondary | ICD-10-CM | POA: Diagnosis not present

## 2022-11-18 DIAGNOSIS — M545 Low back pain, unspecified: Secondary | ICD-10-CM | POA: Diagnosis not present

## 2022-11-18 DIAGNOSIS — M9901 Segmental and somatic dysfunction of cervical region: Secondary | ICD-10-CM | POA: Diagnosis not present

## 2022-11-18 DIAGNOSIS — M546 Pain in thoracic spine: Secondary | ICD-10-CM | POA: Diagnosis not present

## 2022-11-18 DIAGNOSIS — M9903 Segmental and somatic dysfunction of lumbar region: Secondary | ICD-10-CM | POA: Diagnosis not present

## 2022-11-19 DIAGNOSIS — M9901 Segmental and somatic dysfunction of cervical region: Secondary | ICD-10-CM | POA: Diagnosis not present

## 2022-11-19 DIAGNOSIS — M542 Cervicalgia: Secondary | ICD-10-CM | POA: Diagnosis not present

## 2022-11-19 DIAGNOSIS — M545 Low back pain, unspecified: Secondary | ICD-10-CM | POA: Diagnosis not present

## 2022-11-19 DIAGNOSIS — M9903 Segmental and somatic dysfunction of lumbar region: Secondary | ICD-10-CM | POA: Diagnosis not present

## 2022-11-19 DIAGNOSIS — M9902 Segmental and somatic dysfunction of thoracic region: Secondary | ICD-10-CM | POA: Diagnosis not present

## 2022-11-19 DIAGNOSIS — M546 Pain in thoracic spine: Secondary | ICD-10-CM | POA: Diagnosis not present

## 2022-11-23 DIAGNOSIS — M546 Pain in thoracic spine: Secondary | ICD-10-CM | POA: Diagnosis not present

## 2022-11-23 DIAGNOSIS — M545 Low back pain, unspecified: Secondary | ICD-10-CM | POA: Diagnosis not present

## 2022-11-23 DIAGNOSIS — M9902 Segmental and somatic dysfunction of thoracic region: Secondary | ICD-10-CM | POA: Diagnosis not present

## 2022-11-23 DIAGNOSIS — M542 Cervicalgia: Secondary | ICD-10-CM | POA: Diagnosis not present

## 2022-11-23 DIAGNOSIS — M9903 Segmental and somatic dysfunction of lumbar region: Secondary | ICD-10-CM | POA: Diagnosis not present

## 2022-11-23 DIAGNOSIS — M9901 Segmental and somatic dysfunction of cervical region: Secondary | ICD-10-CM | POA: Diagnosis not present

## 2022-11-25 DIAGNOSIS — M545 Low back pain, unspecified: Secondary | ICD-10-CM | POA: Diagnosis not present

## 2022-11-25 DIAGNOSIS — M9902 Segmental and somatic dysfunction of thoracic region: Secondary | ICD-10-CM | POA: Diagnosis not present

## 2022-11-25 DIAGNOSIS — M542 Cervicalgia: Secondary | ICD-10-CM | POA: Diagnosis not present

## 2022-11-25 DIAGNOSIS — M546 Pain in thoracic spine: Secondary | ICD-10-CM | POA: Diagnosis not present

## 2022-11-25 DIAGNOSIS — M9903 Segmental and somatic dysfunction of lumbar region: Secondary | ICD-10-CM | POA: Diagnosis not present

## 2022-11-25 DIAGNOSIS — M9901 Segmental and somatic dysfunction of cervical region: Secondary | ICD-10-CM | POA: Diagnosis not present

## 2022-11-26 DIAGNOSIS — M9901 Segmental and somatic dysfunction of cervical region: Secondary | ICD-10-CM | POA: Diagnosis not present

## 2022-11-26 DIAGNOSIS — M546 Pain in thoracic spine: Secondary | ICD-10-CM | POA: Diagnosis not present

## 2022-11-26 DIAGNOSIS — M9902 Segmental and somatic dysfunction of thoracic region: Secondary | ICD-10-CM | POA: Diagnosis not present

## 2022-11-26 DIAGNOSIS — M9903 Segmental and somatic dysfunction of lumbar region: Secondary | ICD-10-CM | POA: Diagnosis not present

## 2022-11-26 DIAGNOSIS — M545 Low back pain, unspecified: Secondary | ICD-10-CM | POA: Diagnosis not present

## 2022-11-26 DIAGNOSIS — M542 Cervicalgia: Secondary | ICD-10-CM | POA: Diagnosis not present

## 2022-11-30 DIAGNOSIS — M542 Cervicalgia: Secondary | ICD-10-CM | POA: Diagnosis not present

## 2022-11-30 DIAGNOSIS — M546 Pain in thoracic spine: Secondary | ICD-10-CM | POA: Diagnosis not present

## 2022-11-30 DIAGNOSIS — M9903 Segmental and somatic dysfunction of lumbar region: Secondary | ICD-10-CM | POA: Diagnosis not present

## 2022-11-30 DIAGNOSIS — M9901 Segmental and somatic dysfunction of cervical region: Secondary | ICD-10-CM | POA: Diagnosis not present

## 2022-11-30 DIAGNOSIS — M9902 Segmental and somatic dysfunction of thoracic region: Secondary | ICD-10-CM | POA: Diagnosis not present

## 2022-11-30 DIAGNOSIS — M545 Low back pain, unspecified: Secondary | ICD-10-CM | POA: Diagnosis not present

## 2022-12-02 DIAGNOSIS — M9903 Segmental and somatic dysfunction of lumbar region: Secondary | ICD-10-CM | POA: Diagnosis not present

## 2022-12-02 DIAGNOSIS — M9901 Segmental and somatic dysfunction of cervical region: Secondary | ICD-10-CM | POA: Diagnosis not present

## 2022-12-02 DIAGNOSIS — M546 Pain in thoracic spine: Secondary | ICD-10-CM | POA: Diagnosis not present

## 2022-12-02 DIAGNOSIS — M542 Cervicalgia: Secondary | ICD-10-CM | POA: Diagnosis not present

## 2022-12-02 DIAGNOSIS — M545 Low back pain, unspecified: Secondary | ICD-10-CM | POA: Diagnosis not present

## 2022-12-02 DIAGNOSIS — M9902 Segmental and somatic dysfunction of thoracic region: Secondary | ICD-10-CM | POA: Diagnosis not present

## 2022-12-03 DIAGNOSIS — M9902 Segmental and somatic dysfunction of thoracic region: Secondary | ICD-10-CM | POA: Diagnosis not present

## 2022-12-03 DIAGNOSIS — M9901 Segmental and somatic dysfunction of cervical region: Secondary | ICD-10-CM | POA: Diagnosis not present

## 2022-12-03 DIAGNOSIS — M546 Pain in thoracic spine: Secondary | ICD-10-CM | POA: Diagnosis not present

## 2022-12-03 DIAGNOSIS — M545 Low back pain, unspecified: Secondary | ICD-10-CM | POA: Diagnosis not present

## 2022-12-03 DIAGNOSIS — M9903 Segmental and somatic dysfunction of lumbar region: Secondary | ICD-10-CM | POA: Diagnosis not present

## 2022-12-03 DIAGNOSIS — M542 Cervicalgia: Secondary | ICD-10-CM | POA: Diagnosis not present

## 2022-12-07 DIAGNOSIS — M546 Pain in thoracic spine: Secondary | ICD-10-CM | POA: Diagnosis not present

## 2022-12-07 DIAGNOSIS — M9903 Segmental and somatic dysfunction of lumbar region: Secondary | ICD-10-CM | POA: Diagnosis not present

## 2022-12-07 DIAGNOSIS — M9902 Segmental and somatic dysfunction of thoracic region: Secondary | ICD-10-CM | POA: Diagnosis not present

## 2022-12-07 DIAGNOSIS — M9901 Segmental and somatic dysfunction of cervical region: Secondary | ICD-10-CM | POA: Diagnosis not present

## 2022-12-07 DIAGNOSIS — M542 Cervicalgia: Secondary | ICD-10-CM | POA: Diagnosis not present

## 2022-12-07 DIAGNOSIS — M545 Low back pain, unspecified: Secondary | ICD-10-CM | POA: Diagnosis not present

## 2022-12-09 DIAGNOSIS — M542 Cervicalgia: Secondary | ICD-10-CM | POA: Diagnosis not present

## 2022-12-09 DIAGNOSIS — M545 Low back pain, unspecified: Secondary | ICD-10-CM | POA: Diagnosis not present

## 2022-12-09 DIAGNOSIS — M9901 Segmental and somatic dysfunction of cervical region: Secondary | ICD-10-CM | POA: Diagnosis not present

## 2022-12-09 DIAGNOSIS — M546 Pain in thoracic spine: Secondary | ICD-10-CM | POA: Diagnosis not present

## 2022-12-09 DIAGNOSIS — M9903 Segmental and somatic dysfunction of lumbar region: Secondary | ICD-10-CM | POA: Diagnosis not present

## 2022-12-09 DIAGNOSIS — M9902 Segmental and somatic dysfunction of thoracic region: Secondary | ICD-10-CM | POA: Diagnosis not present

## 2022-12-10 DIAGNOSIS — M9903 Segmental and somatic dysfunction of lumbar region: Secondary | ICD-10-CM | POA: Diagnosis not present

## 2022-12-10 DIAGNOSIS — M545 Low back pain, unspecified: Secondary | ICD-10-CM | POA: Diagnosis not present

## 2022-12-10 DIAGNOSIS — M9902 Segmental and somatic dysfunction of thoracic region: Secondary | ICD-10-CM | POA: Diagnosis not present

## 2022-12-10 DIAGNOSIS — M9901 Segmental and somatic dysfunction of cervical region: Secondary | ICD-10-CM | POA: Diagnosis not present

## 2022-12-10 DIAGNOSIS — M542 Cervicalgia: Secondary | ICD-10-CM | POA: Diagnosis not present

## 2022-12-10 DIAGNOSIS — M546 Pain in thoracic spine: Secondary | ICD-10-CM | POA: Diagnosis not present

## 2022-12-14 DIAGNOSIS — M546 Pain in thoracic spine: Secondary | ICD-10-CM | POA: Diagnosis not present

## 2022-12-14 DIAGNOSIS — M9902 Segmental and somatic dysfunction of thoracic region: Secondary | ICD-10-CM | POA: Diagnosis not present

## 2022-12-14 DIAGNOSIS — M9901 Segmental and somatic dysfunction of cervical region: Secondary | ICD-10-CM | POA: Diagnosis not present

## 2022-12-14 DIAGNOSIS — M9903 Segmental and somatic dysfunction of lumbar region: Secondary | ICD-10-CM | POA: Diagnosis not present

## 2022-12-14 DIAGNOSIS — M542 Cervicalgia: Secondary | ICD-10-CM | POA: Diagnosis not present

## 2022-12-14 DIAGNOSIS — M545 Low back pain, unspecified: Secondary | ICD-10-CM | POA: Diagnosis not present

## 2022-12-18 DIAGNOSIS — Z1331 Encounter for screening for depression: Secondary | ICD-10-CM | POA: Diagnosis not present

## 2022-12-18 DIAGNOSIS — Z0000 Encounter for general adult medical examination without abnormal findings: Secondary | ICD-10-CM | POA: Diagnosis not present

## 2022-12-18 DIAGNOSIS — Z85820 Personal history of malignant melanoma of skin: Secondary | ICD-10-CM | POA: Diagnosis not present

## 2022-12-18 DIAGNOSIS — I10 Essential (primary) hypertension: Secondary | ICD-10-CM | POA: Diagnosis not present

## 2022-12-18 DIAGNOSIS — I700 Atherosclerosis of aorta: Secondary | ICD-10-CM | POA: Diagnosis not present

## 2023-05-05 DIAGNOSIS — N1831 Chronic kidney disease, stage 3a: Secondary | ICD-10-CM | POA: Diagnosis not present

## 2023-05-05 DIAGNOSIS — I10 Essential (primary) hypertension: Secondary | ICD-10-CM | POA: Diagnosis not present

## 2023-05-17 DIAGNOSIS — L578 Other skin changes due to chronic exposure to nonionizing radiation: Secondary | ICD-10-CM | POA: Diagnosis not present

## 2023-05-17 DIAGNOSIS — Z85820 Personal history of malignant melanoma of skin: Secondary | ICD-10-CM | POA: Diagnosis not present

## 2023-05-17 DIAGNOSIS — L719 Rosacea, unspecified: Secondary | ICD-10-CM | POA: Diagnosis not present

## 2023-05-17 DIAGNOSIS — D1801 Hemangioma of skin and subcutaneous tissue: Secondary | ICD-10-CM | POA: Diagnosis not present

## 2023-05-17 DIAGNOSIS — D485 Neoplasm of uncertain behavior of skin: Secondary | ICD-10-CM | POA: Diagnosis not present

## 2023-05-17 DIAGNOSIS — L821 Other seborrheic keratosis: Secondary | ICD-10-CM | POA: Diagnosis not present

## 2023-05-17 DIAGNOSIS — D229 Melanocytic nevi, unspecified: Secondary | ICD-10-CM | POA: Diagnosis not present

## 2023-05-17 DIAGNOSIS — L814 Other melanin hyperpigmentation: Secondary | ICD-10-CM | POA: Diagnosis not present

## 2023-05-25 DIAGNOSIS — I10 Essential (primary) hypertension: Secondary | ICD-10-CM | POA: Diagnosis not present

## 2023-07-30 DIAGNOSIS — C4330 Malignant melanoma of unspecified part of face: Secondary | ICD-10-CM | POA: Diagnosis not present

## 2023-11-29 DIAGNOSIS — L814 Other melanin hyperpigmentation: Secondary | ICD-10-CM | POA: Diagnosis not present

## 2023-11-29 DIAGNOSIS — M51361 Other intervertebral disc degeneration, lumbar region with lower extremity pain only: Secondary | ICD-10-CM | POA: Diagnosis not present

## 2023-11-29 DIAGNOSIS — L821 Other seborrheic keratosis: Secondary | ICD-10-CM | POA: Diagnosis not present

## 2023-11-29 DIAGNOSIS — Z6826 Body mass index (BMI) 26.0-26.9, adult: Secondary | ICD-10-CM | POA: Diagnosis not present

## 2023-11-29 DIAGNOSIS — M4316 Spondylolisthesis, lumbar region: Secondary | ICD-10-CM | POA: Diagnosis not present

## 2023-11-29 DIAGNOSIS — M47816 Spondylosis without myelopathy or radiculopathy, lumbar region: Secondary | ICD-10-CM | POA: Diagnosis not present

## 2023-11-29 DIAGNOSIS — Z85820 Personal history of malignant melanoma of skin: Secondary | ICD-10-CM | POA: Diagnosis not present

## 2023-11-29 DIAGNOSIS — L578 Other skin changes due to chronic exposure to nonionizing radiation: Secondary | ICD-10-CM | POA: Diagnosis not present

## 2023-11-29 DIAGNOSIS — D1801 Hemangioma of skin and subcutaneous tissue: Secondary | ICD-10-CM | POA: Diagnosis not present

## 2023-11-29 DIAGNOSIS — D229 Melanocytic nevi, unspecified: Secondary | ICD-10-CM | POA: Diagnosis not present

## 2023-12-21 DIAGNOSIS — Z0000 Encounter for general adult medical examination without abnormal findings: Secondary | ICD-10-CM | POA: Diagnosis not present

## 2023-12-21 DIAGNOSIS — I700 Atherosclerosis of aorta: Secondary | ICD-10-CM | POA: Diagnosis not present

## 2023-12-21 DIAGNOSIS — I10 Essential (primary) hypertension: Secondary | ICD-10-CM | POA: Diagnosis not present

## 2023-12-21 DIAGNOSIS — N1831 Chronic kidney disease, stage 3a: Secondary | ICD-10-CM | POA: Diagnosis not present

## 2023-12-21 DIAGNOSIS — I2510 Atherosclerotic heart disease of native coronary artery without angina pectoris: Secondary | ICD-10-CM | POA: Diagnosis not present

## 2023-12-21 DIAGNOSIS — E782 Mixed hyperlipidemia: Secondary | ICD-10-CM | POA: Diagnosis not present

## 2023-12-21 DIAGNOSIS — Z1331 Encounter for screening for depression: Secondary | ICD-10-CM | POA: Diagnosis not present

## 2023-12-22 DIAGNOSIS — C4372 Malignant melanoma of left lower limb, including hip: Secondary | ICD-10-CM | POA: Diagnosis not present

## 2023-12-22 DIAGNOSIS — C4371 Malignant melanoma of right lower limb, including hip: Secondary | ICD-10-CM | POA: Diagnosis not present

## 2023-12-22 DIAGNOSIS — M4316 Spondylolisthesis, lumbar region: Secondary | ICD-10-CM | POA: Diagnosis not present

## 2023-12-22 DIAGNOSIS — M79604 Pain in right leg: Secondary | ICD-10-CM | POA: Diagnosis not present

## 2024-01-17 DIAGNOSIS — M4316 Spondylolisthesis, lumbar region: Secondary | ICD-10-CM | POA: Diagnosis not present

## 2024-01-17 DIAGNOSIS — Z6826 Body mass index (BMI) 26.0-26.9, adult: Secondary | ICD-10-CM | POA: Diagnosis not present

## 2024-04-18 DIAGNOSIS — Z6826 Body mass index (BMI) 26.0-26.9, adult: Secondary | ICD-10-CM | POA: Diagnosis not present

## 2024-04-18 DIAGNOSIS — M4316 Spondylolisthesis, lumbar region: Secondary | ICD-10-CM | POA: Diagnosis not present

## 2024-04-21 DIAGNOSIS — C4330 Malignant melanoma of unspecified part of face: Secondary | ICD-10-CM | POA: Diagnosis not present

## 2024-06-12 ENCOUNTER — Ambulatory Visit: Admission: EM | Admit: 2024-06-12 | Discharge: 2024-06-12 | Disposition: A | Discharge status: Home / Self Care

## 2024-06-12 NOTE — ED Notes (Signed)
 When pulled for triage, pt reports he is just here for a medication. Upon asking what medication he needs, pt provides an empty bottle of hydrocodone  that he received at urgent care years ago. Made patient aware that I can triage him for his back pain, but prescriptions would be up to the provider. Pt asked to make sure the provider here today would write this medication - checked with [place], MD who stated she would likely do an IM injection for pain at urgent care and send out muscle relaxant prescription. Relayed information to patient who wanted to leave without being seen if not provided with the hydrocodone  prescription.
